# Patient Record
Sex: Male | Born: 1970 | Race: White | Hispanic: No | Marital: Married | Smoking: Never smoker
Health system: Southern US, Community
[De-identification: ages and names within clinical notes are randomized; demographics above are authoritative.]

## PROBLEM LIST (undated history)

## (undated) DIAGNOSIS — T4145XA Adverse effect of unspecified anesthetic, initial encounter: Secondary | ICD-10-CM

## (undated) DIAGNOSIS — R112 Nausea with vomiting, unspecified: Secondary | ICD-10-CM

## (undated) DIAGNOSIS — T8859XA Other complications of anesthesia, initial encounter: Secondary | ICD-10-CM

## (undated) DIAGNOSIS — N200 Calculus of kidney: Secondary | ICD-10-CM

## (undated) DIAGNOSIS — Z9889 Other specified postprocedural states: Secondary | ICD-10-CM

## (undated) DIAGNOSIS — Z87442 Personal history of urinary calculi: Secondary | ICD-10-CM

## (undated) DIAGNOSIS — N281 Cyst of kidney, acquired: Secondary | ICD-10-CM

## (undated) DIAGNOSIS — K900 Celiac disease: Secondary | ICD-10-CM

## (undated) HISTORY — DX: Calculus of kidney: N20.0

## (undated) HISTORY — PX: APPENDECTOMY: SHX54

## (undated) HISTORY — PX: CHOLECYSTECTOMY: SHX55

---

## 2011-12-02 ENCOUNTER — Emergency Department: Payer: Self-pay | Admitting: Emergency Medicine

## 2011-12-26 ENCOUNTER — Ambulatory Visit: Payer: Self-pay | Admitting: Emergency Medicine

## 2012-01-07 ENCOUNTER — Ambulatory Visit: Payer: Self-pay | Admitting: Emergency Medicine

## 2012-01-07 LAB — CBC WITH DIFFERENTIAL/PLATELET
Basophil %: 0.7 %
Eosinophil #: 0.1 10*3/uL (ref 0.0–0.7)
Eosinophil %: 1.8 %
HCT: 45.9 % (ref 40.0–52.0)
Lymphocyte #: 1.3 10*3/uL (ref 1.0–3.6)
Lymphocyte %: 20.8 %
MCHC: 33.9 g/dL (ref 32.0–36.0)
Monocyte #: 0.5 10*3/uL (ref 0.0–0.7)
Monocyte %: 8.1 %
Neutrophil %: 68.6 %
Platelet: 207 10*3/uL (ref 150–440)
RDW: 12.7 % (ref 11.5–14.5)
WBC: 6.2 10*3/uL (ref 3.8–10.6)

## 2012-01-07 LAB — HEPATIC FUNCTION PANEL A (ARMC)
Alkaline Phosphatase: 52 U/L (ref 50–136)
Bilirubin,Total: 0.6 mg/dL (ref 0.2–1.0)
SGOT(AST): 41 U/L — ABNORMAL HIGH (ref 15–37)

## 2012-01-09 ENCOUNTER — Ambulatory Visit: Payer: Self-pay | Admitting: Emergency Medicine

## 2015-04-09 NOTE — Op Note (Signed)
PATIENT NAME:  Ruben Webster, Ruben Webster MR#:  [id number] DATE OF BIRTH:  17-Jul-1971  DATE OF PROCEDURE:  01/09/2012  PREOPERATIVE DIAGNOSES:  1. Polyp in the gallbladder.  2. Acute cholecystitis.   POSTOPERATIVE DIAGNOSES:   1. Polyp in the gallbladder.  2. Acute cholecystitis.   PROCEDURE PERFORMED: Laparoscopic cholecystectomy with cholangiogram.   SURGEON: Yenny Kosa S. Annessa Satre, MD  INDICATION FOR SURGERY: This patient was seen by me in my office because of right upper quadrant abdominal pain for about a months duration. He also says that he does not feel like eating. He had upper GI endoscopy performed which was normal. CAT scan of the abdomen was done which was normal. Ultrasound showed a polyp in the gallbladder. Patient was then brought to surgery for removal of the gallbladder. We do not know what is causing his pain.   FINDINGS IN SURGERY: Patient had gallbladder filled with adhesions all the way down to the lower end of the gallbladder.   DESCRIPTION OF PROCEDURE: After he was put to sleep, a small incision was made over the umbilicus. After cutting skin and subcutaneous tissue, the fascia was cut and the abdomen was entered under direct vision. Another trocar was put in the epigastric region, two 5 mm put in the right upper quadrant of the abdomen. First of all initially when we went in the gallbladder seemed to be filled with adhesions so all the adhesions were slowly, slowly lysed down to the lower end of the gallbladder where cystic artery was then visualized. It was then clipped and cut. Dissection was done to find out where the cystic duct gallbladder junction was then I did a cholangiogram with Kumar clamp and the cholangiogram was normal. After that cystic duct was then clipped three times and then cut and gallbladder was lifted up from the liver bed and taken off completely. The bleeding from the liver bed was stopped. Irrigation of the liver bed was performed which showed there was no  bleeding or biliary leakage there. After this was done all the trocars were then removed under direct vision. Umbilical trocar was then closed with interrupted 0 Vicryl sutures and Marcaine was injected and staples applied. Patient tolerated procedure well, sent to recovery room in satisfactory condition.   ____________________________ Welford Roche Phylis Bougie, MD msh:cms D: 01/09/2012 11:51:29 ET T: 01/09/2012 12:41:52 ET JOB#: [id number]  cc: Imanii Gosdin S. Phylis Bougie, MD, <Dictator> Guadalupe Maple, MD Sharene Butters MD ELECTRONICALLY SIGNED 01/14/2012 13:03

## 2016-09-06 ENCOUNTER — Other Ambulatory Visit: Payer: Self-pay | Admitting: Internal Medicine

## 2016-09-06 DIAGNOSIS — K900 Celiac disease: Secondary | ICD-10-CM | POA: Insufficient documentation

## 2016-09-06 DIAGNOSIS — N200 Calculus of kidney: Secondary | ICD-10-CM

## 2016-09-06 DIAGNOSIS — R1084 Generalized abdominal pain: Secondary | ICD-10-CM | POA: Insufficient documentation

## 2016-09-13 ENCOUNTER — Ambulatory Visit
Admission: RE | Admit: 2016-09-13 | Discharge: 2016-09-13 | Disposition: A | Discharge status: Home / Self Care | Payer: BLUE CROSS/BLUE SHIELD | Source: Ambulatory Visit | Attending: Internal Medicine | Admitting: Internal Medicine

## 2016-09-13 DIAGNOSIS — N200 Calculus of kidney: Secondary | ICD-10-CM | POA: Diagnosis present

## 2016-09-13 DIAGNOSIS — N281 Cyst of kidney, acquired: Secondary | ICD-10-CM | POA: Insufficient documentation

## 2016-09-20 ENCOUNTER — Other Ambulatory Visit: Payer: Self-pay | Admitting: Internal Medicine

## 2016-09-20 DIAGNOSIS — N281 Cyst of kidney, acquired: Secondary | ICD-10-CM

## 2016-09-27 ENCOUNTER — Encounter
Admission: RE | Admit: 2016-09-27 | Discharge: 2016-09-27 | Disposition: A | Discharge status: Home / Self Care | Payer: BLUE CROSS/BLUE SHIELD | Source: Ambulatory Visit | Attending: Surgery | Admitting: Surgery

## 2016-09-27 HISTORY — DX: Other complications of anesthesia, initial encounter: T88.59XA

## 2016-09-27 HISTORY — DX: Personal history of urinary calculi: Z87.442

## 2016-09-27 HISTORY — DX: Adverse effect of unspecified anesthetic, initial encounter: T41.45XA

## 2016-09-27 HISTORY — DX: Nausea with vomiting, unspecified: R11.2

## 2016-09-27 HISTORY — DX: Cyst of kidney, acquired: N28.1

## 2016-09-27 HISTORY — DX: Other specified postprocedural states: Z98.890

## 2016-09-27 NOTE — Patient Instructions (Signed)
  Your procedure is scheduled on: 10-01-16 Report to Same Day Surgery 2nd floor medical mall To find out your arrival time please call [phone] between 1PM - 3PM on 09-30-16  Remember: Instructions that are not followed completely may result in serious medical risk, up to and including death, or upon the discretion of your surgeon and anesthesiologist your surgery may need to be rescheduled.    _x___ 1. Do not eat food or drink liquids after midnight. No gum chewing or hard candies.     __x__ 2. No Alcohol for 24 hours before or after surgery.   __x__3. No Smoking for 24 prior to surgery.   ____  4. Bring all medications with you on the day of surgery if instructed.    __x__ 5. Notify your doctor if there is any change in your medical condition     (cold, fever, infections).     Do not wear jewelry, make-up, hairpins, clips or nail polish.  Do not wear lotions, powders, or perfumes. You may wear deodorant.  Do not shave 48 hours prior to surgery. Men may shave face and neck.  Do not bring valuables to the hospital.    [facility] is not responsible for any belongings or valuables.               Contacts, dentures or bridgework may not be worn into surgery.  Leave your suitcase in the car. After surgery it may be brought to your room.  For patients admitted to the hospital, discharge time is determined by your treatment team.   Patients discharged the day of surgery will not be allowed to drive home.    Please read over the following fact sheets that you were given:   [facility] Preparing for Surgery and or MRSA Information   ____ Take these medicines the morning of surgery with A SIP OF WATER:    1. NONE  2.  3.  4.  5.  6.  ____Fleets enema or Magnesium Citrate as directed.   ____ Use CHG Soap or sage wipes as directed on instruction sheet   ____ Use inhalers on the day of surgery and bring to hospital day of surgery  ____ Stop metformin 2 days prior to  surgery    ____ Take 1/2 of usual insulin dose the night before surgery and none on the morning of surgery.   ____ Stop aspirin or coumadin, or plavix  x__ Stop Anti-inflammatories such as Advil, Aleve, Ibuprofen, Motrin, Naproxen,          Naprosyn, Goodies powders or aspirin products NOW-Ok to take Tylenol.   ____ Stop supplements until after surgery.    ____ Bring C-Pap to the hospital.

## 2016-10-01 ENCOUNTER — Encounter
Admission: RE | Disposition: A | Discharge status: Home / Self Care | Payer: Self-pay | Source: Ambulatory Visit | Attending: Surgery

## 2016-10-01 ENCOUNTER — Ambulatory Visit: Payer: BLUE CROSS/BLUE SHIELD | Admitting: Anesthesiology

## 2016-10-01 ENCOUNTER — Ambulatory Visit
Admission: RE | Admit: 2016-10-01 | Discharge: 2016-10-01 | Disposition: A | Discharge status: Home / Self Care | Payer: BLUE CROSS/BLUE SHIELD | Source: Ambulatory Visit | Attending: Surgery | Admitting: Surgery

## 2016-10-01 DIAGNOSIS — K4090 Unilateral inguinal hernia, without obstruction or gangrene, not specified as recurrent: Secondary | ICD-10-CM | POA: Insufficient documentation

## 2016-10-01 DIAGNOSIS — D176 Benign lipomatous neoplasm of spermatic cord: Secondary | ICD-10-CM | POA: Diagnosis not present

## 2016-10-01 HISTORY — PX: INGUINAL HERNIA REPAIR: SHX194

## 2016-10-01 SURGERY — REPAIR, HERNIA, INGUINAL, ADULT
Anesthesia: General | Laterality: Left | Wound class: Clean

## 2016-10-01 MED ORDER — FENTANYL CITRATE (PF) 100 MCG/2ML IJ SOLN
25.0000 ug | INTRAMUSCULAR | Status: DC | PRN
  Administered 2016-10-01 (×4): 25 ug via INTRAVENOUS

## 2016-10-01 MED ORDER — FAMOTIDINE 20 MG PO TABS
ORAL_TABLET | ORAL | Status: AC
  Administered 2016-10-01: 20 mg via ORAL
  Filled 2016-10-01: qty 1

## 2016-10-01 MED ORDER — SUGAMMADEX SODIUM 200 MG/2ML IV SOLN
INTRAVENOUS | Status: DC | PRN
  Administered 2016-10-01: 180 mg via INTRAVENOUS

## 2016-10-01 MED ORDER — FENTANYL CITRATE (PF) 100 MCG/2ML IJ SOLN
INTRAMUSCULAR | Status: AC
  Filled 2016-10-01: qty 2

## 2016-10-01 MED ORDER — HYDROCODONE-ACETAMINOPHEN 5-325 MG PO TABS
ORAL_TABLET | ORAL | Status: AC
  Filled 2016-10-01: qty 1

## 2016-10-01 MED ORDER — VANCOMYCIN HCL IN DEXTROSE 1-5 GM/200ML-% IV SOLN
INTRAVENOUS | Status: AC
  Administered 2016-10-01: 1 g via INTRAVENOUS
  Filled 2016-10-01: qty 200

## 2016-10-01 MED ORDER — VANCOMYCIN HCL IN DEXTROSE 1-5 GM/200ML-% IV SOLN
1000.0000 mg | Freq: Once | INTRAVENOUS | Status: AC
  Administered 2016-10-01: 1 g via INTRAVENOUS

## 2016-10-01 MED ORDER — BUPIVACAINE-EPINEPHRINE (PF) 0.5% -1:200000 IJ SOLN
INTRAMUSCULAR | Status: DC | PRN
  Administered 2016-10-01: 18 mL via PERINEURAL

## 2016-10-01 MED ORDER — MIDAZOLAM HCL 2 MG/2ML IJ SOLN
INTRAMUSCULAR | Status: DC | PRN
  Administered 2016-10-01 (×2): 2 mg via INTRAVENOUS

## 2016-10-01 MED ORDER — HYDROCODONE-ACETAMINOPHEN 5-325 MG PO TABS
1.0000 | ORAL_TABLET | ORAL | Status: DC | PRN
  Administered 2016-10-01: 1 via ORAL

## 2016-10-01 MED ORDER — HYDROCODONE-ACETAMINOPHEN 5-325 MG PO TABS: 1.0000 | ORAL_TABLET | ORAL | 0 refills | Status: DC | PRN

## 2016-10-01 MED ORDER — SUCCINYLCHOLINE CHLORIDE 20 MG/ML IJ SOLN
INTRAMUSCULAR | Status: DC | PRN
Start: 2016-10-01 — End: 2016-10-01
  Administered 2016-10-01: 100 mg via INTRAVENOUS

## 2016-10-01 MED ORDER — FAMOTIDINE 20 MG PO TABS
20.0000 mg | ORAL_TABLET | Freq: Once | ORAL | Status: AC
  Administered 2016-10-01: 20 mg via ORAL

## 2016-10-01 MED ORDER — FENTANYL CITRATE (PF) 100 MCG/2ML IJ SOLN
INTRAMUSCULAR | Status: DC | PRN
  Administered 2016-10-01: 100 ug via INTRAVENOUS
  Administered 2016-10-01: 50 ug via INTRAVENOUS

## 2016-10-01 MED ORDER — BUPIVACAINE-EPINEPHRINE (PF) 0.5% -1:200000 IJ SOLN
INTRAMUSCULAR | Status: AC
  Filled 2016-10-01: qty 30

## 2016-10-01 MED ORDER — ONDANSETRON HCL 4 MG/2ML IJ SOLN: 4.0000 mg | Freq: Once | INTRAMUSCULAR | Status: DC | PRN

## 2016-10-01 MED ORDER — PROPOFOL 10 MG/ML IV BOLUS
INTRAVENOUS | Status: DC | PRN
  Administered 2016-10-01: 180 mg via INTRAVENOUS

## 2016-10-01 MED ORDER — BUPIVACAINE-EPINEPHRINE (PF) 0.5% -1:200000 IJ SOLN
INTRAMUSCULAR | Status: AC
  Filled 2016-10-01: qty 10

## 2016-10-01 MED ORDER — LACTATED RINGERS IV SOLN
INTRAVENOUS | Status: DC
  Administered 2016-10-01: 11:00:00 via INTRAVENOUS

## 2016-10-01 MED ORDER — ONDANSETRON HCL 4 MG/2ML IJ SOLN
INTRAMUSCULAR | Status: DC | PRN
Start: 2016-10-01 — End: 2016-10-01
  Administered 2016-10-01: 4 mg via INTRAVENOUS

## 2016-10-01 MED ORDER — ROCURONIUM BROMIDE 100 MG/10ML IV SOLN
INTRAVENOUS | Status: DC | PRN
  Administered 2016-10-01: 10 mg via INTRAVENOUS
  Administered 2016-10-01: 35 mg via INTRAVENOUS
  Administered 2016-10-01: 10 mg via INTRAVENOUS
  Administered 2016-10-01: 5 mg via INTRAVENOUS

## 2016-10-01 MED ORDER — LIDOCAINE HCL (CARDIAC) 20 MG/ML IV SOLN
INTRAVENOUS | Status: DC | PRN
  Administered 2016-10-01: 100 mg via INTRAVENOUS

## 2016-10-01 SURGICAL SUPPLY — 27 items
BLADE SURG 15 STRL LF DISP TIS (BLADE) ×1 IMPLANT
BLADE SURG 15 STRL SS (BLADE) ×1
CANISTER SUCT 1200ML W/VALVE (MISCELLANEOUS) ×2 IMPLANT
CHLORAPREP W/TINT 26ML (MISCELLANEOUS) ×2 IMPLANT
DERMABOND ADVANCED (GAUZE/BANDAGES/DRESSINGS) ×1
DERMABOND ADVANCED .7 DNX12 (GAUZE/BANDAGES/DRESSINGS) ×1 IMPLANT
DRAIN PENROSE 5/8X18 LTX STRL (WOUND CARE) ×2 IMPLANT
DRAPE LAPAROTOMY 77X122 PED (DRAPES) ×2 IMPLANT
ELECT REM PT RETURN 9FT ADLT (ELECTROSURGICAL) ×2
ELECTRODE REM PT RTRN 9FT ADLT (ELECTROSURGICAL) ×1 IMPLANT
GLOVE BIO SURGEON STRL SZ7.5 (GLOVE) ×10 IMPLANT
GOWN STRL REUS W/ TWL LRG LVL3 (GOWN DISPOSABLE) ×3 IMPLANT
GOWN STRL REUS W/TWL LRG LVL3 (GOWN DISPOSABLE) ×3
KIT RM TURNOVER STRD PROC AR (KITS) ×2 IMPLANT
LABEL OR SOLS (LABEL) ×2 IMPLANT
LIQUID BAND (GAUZE/BANDAGES/DRESSINGS) ×2 IMPLANT
MESH SYNTHETIC 4X6 SOFT BARD (Mesh General) ×1 IMPLANT
MESH SYNTHETIC SOFT BARD 4X6 (Mesh General) ×1 IMPLANT
NEEDLE HYPO 25X1 1.5 SAFETY (NEEDLE) ×2 IMPLANT
NS IRRIG 500ML POUR BTL (IV SOLUTION) ×2 IMPLANT
PACK BASIN MINOR ARMC (MISCELLANEOUS) ×2 IMPLANT
SUT CHROMIC 4 0 RB 1X27 (SUTURE) ×2 IMPLANT
SUT MNCRL AB 4-0 PS2 18 (SUTURE) ×2 IMPLANT
SUT SURGILON 0 30 BLK (SUTURE) ×6 IMPLANT
SUT VIC AB 4-0 SH 27 (SUTURE) ×2
SUT VIC AB 4-0 SH 27XANBCTRL (SUTURE) ×2 IMPLANT
SYRINGE 10CC LL (SYRINGE) ×2 IMPLANT

## 2016-10-01 NOTE — Discharge Instructions (Addendum)
AMBULATORY SURGERY  DISCHARGE INSTRUCTIONS   1) The drugs that you were given will stay in your system until tomorrow so for the next 24 hours you should not:  A) Drive an automobile B) Make any legal decisions C) Drink any alcoholic beverage   2) You may resume regular meals tomorrow.  Today it is better to start with liquids and gradually work up to solid foods.  You may eat anything you prefer, but it is better to start with liquids, then soup and crackers, and gradually work up to solid foods.   3) Please notify your doctor immediately if you have any unusual bleeding, trouble breathing, redness and pain at the surgery site, drainage, fever, or pain not relieved by medication.    4) Additional Instructions:        Please contact your physician with any problems or Same Day Surgery at [phone], Monday through Friday 6 am to 4 pm, or [place] at [facility] number at [phone].Take Tylenol or Norco if needed for pain.  Should not drive or do anything dangerous when taking Norco.  May shower.  Avoid straining and heavy lifting.

## 2016-10-01 NOTE — Anesthesia Procedure Notes (Addendum)
Procedure Name: Intubation Performed by: Lance Muss Pre-anesthesia Checklist: Patient identified, Patient being monitored, Timeout performed, Emergency Drugs available and Suction available Patient Re-evaluated:Patient Re-evaluated prior to inductionOxygen Delivery Method: Circle system utilized Preoxygenation: Pre-oxygenation with 100% oxygen Intubation Type: IV induction Ventilation: Mask ventilation without difficulty Laryngoscope Size: Mac and 3 Grade View: Grade I Tube type: Oral Tube size: 7.5 mm Number of attempts: 1 Airway Equipment and Method: Stylet Placement Confirmation: ETT inserted through vocal cords under direct vision,  positive ETCO2 and breath sounds checked- equal and bilateral Secured at: 22 cm Tube secured with: Tape Dental Injury: Teeth and Oropharynx as per pre-operative assessment  Comments: Preexisting chipped front top right tooth

## 2016-10-01 NOTE — Transfer of Care (Signed)
Immediate Anesthesia Transfer of Care Note  Patient: Ruben Webster  Procedure(s) Performed: Procedure(s): HERNIA REPAIR INGUINAL ADULT with mesh (Left)  Patient Location: PACU  Anesthesia Type:General  Level of Consciousness: sedated and responds to stimulation  Airway & Oxygen Therapy: Patient Spontanous Breathing and Patient connected to face mask oxygen  Post-op Assessment: Report given to RN and Post -op Vital signs reviewed and stable  Post vital signs: Reviewed and stable  Last Vitals:  Vitals:   10/01/16 1325 10/01/16 1326  BP: (!) (P) 138/95 (!) 138/95  Pulse:  84  Resp: (P) 17 19  Temp: (P) 36.7 C     Last Pain:  Vitals:   10/01/16 1024  TempSrc: Oral  PainSc: 3          Complications: No apparent anesthesia complications

## 2016-10-01 NOTE — Op Note (Signed)
OPERATIVE REPORT  PREOPERATIVE DIAGNOSIS: left inguinal hernia  POSTOPERATIVE DIAGNOSIS:left  inguinal hernia  PROCEDURE:  left inguinal hernia repair  ANESTHESIA:  General  SURGEON:  Rochel Brome M.D.  INDICATIONS: He has had recent bulging in the left groin with moderate discomfort. A left inguinal hernia was demonstrated on physical exam and repair was recommended for definitive treatment.  With the patient on the operating table in the supine position the left lower quadrant was prepared with clippers and with ChloraPrep and draped in a sterile manner. A transversely oriented suprapubic incision was made and carried down through subcutaneous tissues. Electrocautery was used for hemostasis. The Scarpa's fascia was incised. The external oblique aponeurosis was incised along the course of its fibers to open the external ring and expose the inguinal cord structures. The cord structures were mobilized. A Penrose drain was passed around the cord structures for traction. Cremaster fibers were separated to expose an indirect hernia sac. The sac was dissected free from surrounding structures and followed up to the internal ring. The sac was opened and its continuity with the peritoneal cavity was demonstrated. A large portion of the sac was made up of fat. The sac was partially ligated with a 0 Surgilon pursestring suture and amputated. The thicker fatty portion of the wall of the sac was reduced back into the abdominal cavity. There was also a cord lipoma which was dissected free from surrounding structures and suture ligated with 4-0 Vicryl and amputated. Tissues were not submitted for pathology. The repair was done with 0 Surgilon suturing the conjoined tendon to the shelving edge of the inguinal ligament incorporating transversalis fascia into the repair the last stitch led to satisfactory narrowing of the internal ring.  Bard soft mesh was cut to create an oval shape and was placed over the repair.  This was sutured to the repair with interrupted 0 Surgilon sutures and also sutured medially to the deep fascia and on both sides of the internal ring. Next after seeing hemostasis was intact the cord structures were replaced along the floor of the inguinal canal. The cut edges of the external oblique aponeurosis were closed with a running 4-0 Vicryl suture to re-create the external ring. The deep fascia superior and lateral to the repair site was infiltrated with half percent Sensorcaine with epinephrine. Subcutaneous tissues were also infiltrated. The Scarpa's fascia was closed with interrupted 4-0 Vicryl sutures. The skin was closed with running 4-0 Monocryl subcuticular suture and LiquiBand. The testicle remained in the scrotum  The patient appeared to be in satisfactory condition and was prepared for transfer to the recovery room.  Rochel Brome M.D.

## 2016-10-01 NOTE — H&P (Signed)
  He comes in today for left inguinal hernia repair.  He reports no change in condition since the office visit.  Lab work was reviewed  The left side was marked YES

## 2016-10-01 NOTE — OR Nursing (Signed)
Dr Tamala Julian in to see patient. Patient tolerating po fluids and apple sauce.

## 2016-10-01 NOTE — Anesthesia Preprocedure Evaluation (Addendum)
Anesthesia Evaluation  Patient identified by MRN, date of birth, ID band Patient awake    Reviewed: Allergy & Precautions, NPO status , Patient's Chart, lab work & pertinent test results  History of Anesthesia Complications (+) PONV  Airway Mallampati: II  TM Distance: >3 FB     Dental  (+) Chipped   Pulmonary neg pulmonary ROS,    Pulmonary exam normal        Cardiovascular negative cardio ROS Normal cardiovascular exam     Neuro/Psych negative neurological ROS  negative psych ROS   GI/Hepatic Neg liver ROS,   Endo/Other  negative endocrine ROS  Renal/GU Renal cyst  negative genitourinary   Musculoskeletal negative musculoskeletal ROS (+)   Abdominal Normal abdominal exam  (+)   Peds negative pediatric ROS (+)  Hematology negative hematology ROS (+)   Anesthesia Other Findings   Reproductive/Obstetrics                            Anesthesia Physical Anesthesia Plan  ASA: II  Anesthesia Plan: General   Post-op Pain Management:    Induction: Intravenous  Airway Management Planned: Oral ETT  Additional Equipment:   Intra-op Plan:   Post-operative Plan: Extubation in OR  Informed Consent: I have reviewed the patients History and Physical, chart, labs and discussed the procedure including the risks, benefits and alternatives for the proposed anesthesia with the patient or authorized representative who has indicated his/her understanding and acceptance.   Dental advisory given  Plan Discussed with: CRNA and Surgeon  Anesthesia Plan Comments:         Anesthesia Quick Evaluation

## 2016-10-02 ENCOUNTER — Encounter: Payer: Self-pay | Admitting: Surgery

## 2016-10-02 NOTE — Anesthesia Postprocedure Evaluation (Signed)
Anesthesia Post Note  Patient: Ruben Webster  Procedure(s) Performed: Procedure(s) (LRB): HERNIA REPAIR INGUINAL ADULT with mesh (Left)  Patient location during evaluation: PACU Anesthesia Type: General Level of consciousness: awake and alert and oriented Pain management: pain level controlled Vital Signs Assessment: post-procedure vital signs reviewed and stable Respiratory status: spontaneous breathing Cardiovascular status: blood pressure returned to baseline Anesthetic complications: no    Last Vitals:  Vitals:   10/01/16 1434 10/01/16 1511  BP: 140/85 129/80  Pulse: 81 79  Resp: 16 16  Temp: 37 C 36.6 C    Last Pain:  Vitals:   10/02/16 1046  TempSrc:   PainSc: 0-No pain                 Enedelia Martorelli

## 2016-10-03 ENCOUNTER — Ambulatory Visit: Payer: BLUE CROSS/BLUE SHIELD

## 2016-10-14 ENCOUNTER — Encounter: Payer: Self-pay | Admitting: Surgery

## 2016-10-19 ENCOUNTER — Ambulatory Visit
Admission: RE | Admit: 2016-10-19 | Discharge: 2016-10-19 | Disposition: A | Discharge status: Home / Self Care | Payer: BLUE CROSS/BLUE SHIELD | Source: Ambulatory Visit | Attending: Internal Medicine | Admitting: Internal Medicine

## 2016-10-19 DIAGNOSIS — N281 Cyst of kidney, acquired: Secondary | ICD-10-CM | POA: Insufficient documentation

## 2016-10-19 MED ORDER — GADOBENATE DIMEGLUMINE 529 MG/ML IV SOLN
20.0000 mL | Freq: Once | INTRAVENOUS | Status: AC | PRN
Start: 2016-10-19 — End: 2016-10-19
  Administered 2016-10-19: 18 mL via INTRAVENOUS

## 2017-09-08 DIAGNOSIS — R030 Elevated blood-pressure reading, without diagnosis of hypertension: Secondary | ICD-10-CM | POA: Insufficient documentation

## 2018-01-19 DIAGNOSIS — R1900 Intra-abdominal and pelvic swelling, mass and lump, unspecified site: Secondary | ICD-10-CM | POA: Insufficient documentation

## 2018-01-20 ENCOUNTER — Other Ambulatory Visit: Payer: Self-pay | Admitting: Internal Medicine

## 2018-01-20 DIAGNOSIS — R1900 Intra-abdominal and pelvic swelling, mass and lump, unspecified site: Secondary | ICD-10-CM

## 2018-01-20 DIAGNOSIS — R634 Abnormal weight loss: Secondary | ICD-10-CM

## 2018-01-20 DIAGNOSIS — R1032 Left lower quadrant pain: Secondary | ICD-10-CM

## 2018-01-28 ENCOUNTER — Ambulatory Visit
Admission: RE | Admit: 2018-01-28 | Discharge: 2018-01-28 | Disposition: A | Discharge status: Home / Self Care | Payer: BC Managed Care – PPO | Source: Ambulatory Visit | Attending: Internal Medicine | Admitting: Internal Medicine

## 2018-01-28 DIAGNOSIS — R1032 Left lower quadrant pain: Secondary | ICD-10-CM | POA: Diagnosis present

## 2018-01-28 DIAGNOSIS — N200 Calculus of kidney: Secondary | ICD-10-CM | POA: Diagnosis not present

## 2018-01-28 DIAGNOSIS — K7689 Other specified diseases of liver: Secondary | ICD-10-CM | POA: Insufficient documentation

## 2018-01-28 DIAGNOSIS — R918 Other nonspecific abnormal finding of lung field: Secondary | ICD-10-CM | POA: Insufficient documentation

## 2018-01-28 DIAGNOSIS — N281 Cyst of kidney, acquired: Secondary | ICD-10-CM | POA: Insufficient documentation

## 2018-01-28 DIAGNOSIS — R1900 Intra-abdominal and pelvic swelling, mass and lump, unspecified site: Secondary | ICD-10-CM | POA: Diagnosis present

## 2018-01-28 DIAGNOSIS — R634 Abnormal weight loss: Secondary | ICD-10-CM | POA: Diagnosis not present

## 2018-01-28 MED ORDER — IOPAMIDOL (ISOVUE-300) INJECTION 61%
100.0000 mL | Freq: Once | INTRAVENOUS | Status: AC | PRN
  Administered 2018-01-28: 100 mL via INTRAVENOUS

## 2018-02-05 ENCOUNTER — Other Ambulatory Visit
Admission: RE | Admit: 2018-02-05 | Discharge: 2018-02-05 | Disposition: A | Discharge status: Home / Self Care | Payer: BC Managed Care – PPO | Source: Ambulatory Visit | Attending: Gastroenterology | Admitting: Gastroenterology

## 2018-02-13 ENCOUNTER — Other Ambulatory Visit: Payer: Self-pay | Admitting: Gastroenterology

## 2018-02-13 DIAGNOSIS — R1032 Left lower quadrant pain: Secondary | ICD-10-CM

## 2018-02-13 LAB — MISCELLANEOUS TEST

## 2018-02-17 ENCOUNTER — Other Ambulatory Visit: Payer: Self-pay | Admitting: Internal Medicine

## 2018-02-17 DIAGNOSIS — R911 Solitary pulmonary nodule: Secondary | ICD-10-CM

## 2018-02-23 ENCOUNTER — Ambulatory Visit
Admission: RE | Admit: 2018-02-23 | Discharge: 2018-02-23 | Disposition: A | Discharge status: Home / Self Care | Payer: BC Managed Care – PPO | Source: Ambulatory Visit | Attending: Gastroenterology | Admitting: Gastroenterology

## 2018-02-23 DIAGNOSIS — R1012 Left upper quadrant pain: Secondary | ICD-10-CM | POA: Insufficient documentation

## 2018-02-23 DIAGNOSIS — R1032 Left lower quadrant pain: Secondary | ICD-10-CM | POA: Diagnosis present

## 2018-03-03 ENCOUNTER — Ambulatory Visit
Admission: RE | Admit: 2018-03-03 | Discharge: 2018-03-03 | Disposition: A | Discharge status: Home / Self Care | Payer: BC Managed Care – PPO | Source: Ambulatory Visit | Attending: Internal Medicine | Admitting: Internal Medicine

## 2018-03-03 DIAGNOSIS — R911 Solitary pulmonary nodule: Secondary | ICD-10-CM | POA: Diagnosis present

## 2018-03-03 DIAGNOSIS — R918 Other nonspecific abnormal finding of lung field: Secondary | ICD-10-CM | POA: Diagnosis not present

## 2018-03-05 DIAGNOSIS — R918 Other nonspecific abnormal finding of lung field: Secondary | ICD-10-CM | POA: Insufficient documentation

## 2018-03-10 ENCOUNTER — Ambulatory Visit
Admission: RE | Admit: 2018-03-10 | Discharge: 2018-03-10 | Disposition: A | Discharge status: Home / Self Care | Payer: BC Managed Care – PPO | Source: Ambulatory Visit | Attending: Urology | Admitting: Urology

## 2018-03-10 ENCOUNTER — Ambulatory Visit: Payer: BC Managed Care – PPO | Admitting: Urology

## 2018-03-10 ENCOUNTER — Encounter: Payer: Self-pay | Admitting: Urology

## 2018-03-10 VITALS — BP 118/80 | HR 83 | Resp 16 | Ht 67.5 in | Wt 198.8 lb

## 2018-03-10 DIAGNOSIS — N200 Calculus of kidney: Secondary | ICD-10-CM | POA: Diagnosis not present

## 2018-03-10 LAB — URINALYSIS, COMPLETE
BILIRUBIN UA: NEGATIVE
Glucose, UA: NEGATIVE
Ketones, UA: NEGATIVE
Leukocytes, UA: NEGATIVE
Nitrite, UA: NEGATIVE
PROTEIN UA: NEGATIVE
RBC, UA: NEGATIVE
Specific Gravity, UA: <1.005 — ABNORMAL LOW (ref 1.005–1.030)
Urobilinogen, Ur: 0.2 mg/dL (ref 0.2–1.0)
pH, UA: 6.5 (ref 5.0–7.5)

## 2018-03-12 ENCOUNTER — Other Ambulatory Visit: Payer: Self-pay | Admitting: Gastroenterology

## 2018-03-12 DIAGNOSIS — R1013 Epigastric pain: Secondary | ICD-10-CM

## 2018-03-13 NOTE — Progress Notes (Signed)
03/10/2018 7:04 AM   Lyla Glassing 09-27-1971 [id number]  Referring provider: Adin Hector, MD [facility] [place], [place] [address]  Chief complaint: Stomach pain, weight loss  HPI: Ruben Webster is a 47 year old male seen in consultation at the request of Dr. Caryl Comes for evaluation of nephrolithiasis.  He has been seen by Dr. Caryl Comes and gastroenterology for a 72-month history of left lower quadrant abdominal pain and abnormal weight loss.  His evaluation to date has been negative.  A CT scan of the abdomen pelvis incidentally found a nonobstructing 4 mm left lower pole calculus.  He has no prior history of stone disease.  He has no bothersome lower urinary tract symptoms.  Denies dysuria or gross hematuria.  Denies flank or pelvic pain.  He states his abdominal pain has actually improved over the last 2 weeks.   PMH: Past Medical History:  Diagnosis Date  . Complication of anesthesia   . History of kidney stones    H/O STONES  . PONV (postoperative nausea and vomiting)   . Renal cyst     Surgical History: Past Surgical History:  Procedure Laterality Date  . APPENDECTOMY    . CHOLECYSTECTOMY    . INGUINAL HERNIA REPAIR Left 10/01/2016   Procedure: HERNIA REPAIR INGUINAL ADULT with mesh;  Surgeon: Leonie Green, MD;  Location: [facility] ORS;  Service: General;  Laterality: Left;    Home Medications:  Allergies as of 03/10/2018      Reactions   Other    GLUTEN ALLERGY   Amoxicillin Rash   Penicillins Rash      Medication List        Accurate as of 03/10/18 11:59 PM. Always use your most recent med list.          HYDROcodone-acetaminophen 5-325 MG tablet Commonly known as:  NORCO Take 1-2 tablets by mouth every 4 (four) hours as needed for moderate pain.   ibuprofen 200 MG tablet Commonly known as:  ADVIL,MOTRIN Take 200 mg by mouth every 6 (six) hours as needed.   pantoprazole 40 MG tablet Commonly known as:  PROTONIX     sucralfate 1 g tablet Commonly known as:  CARAFATE Take by mouth.       Allergies:  Allergies  Allergen Reactions  . Other     GLUTEN ALLERGY  . Amoxicillin Rash  . Penicillins Rash    Family History: Family History  Problem Relation Age of Onset  . Prostate cancer Father   . Bladder Cancer Neg Hx   . Kidney cancer Neg Hx     Social History:  reports that he has never smoked. He has never used smokeless tobacco. He reports that he does not drink alcohol or use drugs.  ROS: UROLOGY Frequent Urination?: No Hard to postpone urination?: No Burning/pain with urination?: No Get up at night to urinate?: No Leakage of urine?: No Urine stream starts and stops?: No Trouble starting stream?: No Do you have to strain to urinate?: No Blood in urine?: No Urinary tract infection?: No Sexually transmitted disease?: No Injury to kidneys or bladder?: No Painful intercourse?: No Weak stream?: No Erection problems?: No Penile pain?: No  Gastrointestinal Nausea?: No Vomiting?: No Indigestion/heartburn?: No Diarrhea?: No Constipation?: No  Constitutional Fever: No Night sweats?: No Weight loss?: No Fatigue?: No  Skin Skin rash/lesions?: No Itching?: No  Eyes Blurred vision?: No Double vision?: No  Ears/Nose/Throat Sore throat?: No Sinus problems?: No  Hematologic/Lymphatic Swollen glands?: No  Easy bruising?: No  Cardiovascular Leg swelling?: No Chest pain?: No  Respiratory Cough?: No Shortness of breath?: No  Endocrine Excessive thirst?: No  Musculoskeletal Back pain?: No Joint pain?: No  Neurological Headaches?: No Dizziness?: No  Psychologic Depression?: No Anxiety?: No  Physical Exam: BP 118/80   Pulse 83   Resp 16   Ht 5' 7.5" (1.715 m)   Wt 198 lb 12.8 oz (90.2 kg)   SpO2 96%   BMI 30.68 kg/m   Constitutional:  Alert and oriented, No acute distress. HEENT: [place] AT, moist mucus membranes.  Trachea midline, no  masses. Cardiovascular: No clubbing, cyanosis, or edema. Respiratory: Normal respiratory effort, no increased work of breathing. GI: Abdomen is soft, nontender, nondistended, no abdominal masses GU: No CVA tenderness Lymph: No cervical or inguinal lymphadenopathy. Skin: No rashes, bruises or suspicious lesions. Neurologic: Grossly intact, no focal deficits, moving all 4 extremities. Psychiatric: Normal mood and affect.  Laboratory Data: Lab Results  Component Value Date   WBC 6.2 01/07/2012   HGB 15.6 01/07/2012   HCT 45.9 01/07/2012   MCV 88 01/07/2012   PLT 207 01/07/2012   Urinalysis Dipstick/microscopy negative  Pertinent Imaging: CT images were personally reviewed   Assessment & Plan:    1. Nephrolithiasis 47 year old male with a nonobstructing left lower pole calculus.  He was informed that this would not be a source of his abdominal pain or weight loss.  We discussed stone prevention guidelines and he was provided literature.  I have recommended a follow-up visit and KUB in 1 year.  He was instructed to call earlier for development of renal colic.  - Urinalysis, Complete - Abdomen 1 view (KUB); Future   Return in about 1 year (around 03/11/2019) for [place], KUB.  Abbie Sons, [place] [address], [place], [place] [address] [phone]

## 2018-03-16 ENCOUNTER — Telehealth: Payer: Self-pay

## 2018-03-16 ENCOUNTER — Encounter: Payer: Self-pay | Admitting: Urology

## 2018-03-16 NOTE — Telephone Encounter (Signed)
-----   Message from Abbie Sons, MD sent at 03/13/2018  1:09 PM EDT ----- The left renal calculus is easily visualized on KUB.  Follow-up as scheduled and call earlier for development of renal colic.

## 2018-03-16 NOTE — Telephone Encounter (Signed)
Patient notified

## 2018-03-25 ENCOUNTER — Ambulatory Visit
Admission: RE | Admit: 2018-03-25 | Discharge: 2018-03-25 | Disposition: A | Discharge status: Home / Self Care | Payer: BC Managed Care – PPO | Source: Ambulatory Visit | Attending: Gastroenterology | Admitting: Gastroenterology

## 2018-03-25 DIAGNOSIS — R1013 Epigastric pain: Secondary | ICD-10-CM | POA: Diagnosis present

## 2018-03-25 MED ORDER — TECHNETIUM TC 99M SULFUR COLLOID
2.4280 | Freq: Once | INTRAVENOUS | Status: AC | PRN
  Administered 2018-03-25: 2.568 via ORAL

## 2018-04-09 ENCOUNTER — Other Ambulatory Visit: Payer: BC Managed Care – PPO

## 2018-12-02 ENCOUNTER — Other Ambulatory Visit
Admission: RE | Admit: 2018-12-02 | Discharge: 2018-12-02 | Disposition: A | Discharge status: Home / Self Care | Payer: BC Managed Care – PPO | Source: Ambulatory Visit | Attending: Internal Medicine | Admitting: Internal Medicine

## 2018-12-02 DIAGNOSIS — R079 Chest pain, unspecified: Secondary | ICD-10-CM | POA: Diagnosis present

## 2018-12-02 LAB — TROPONIN I: Troponin I: <0.03 ng/mL (ref <0.03)

## 2019-03-04 ENCOUNTER — Telehealth: Payer: Self-pay | Admitting: Urology

## 2019-03-04 DIAGNOSIS — N200 Calculus of kidney: Secondary | ICD-10-CM

## 2019-03-04 NOTE — Telephone Encounter (Signed)
Order was entered 

## 2019-03-04 NOTE — Telephone Encounter (Signed)
Patient has a follow up with you but there is not an order for his KUB   Thanks  Sharyn Lull

## 2019-03-11 ENCOUNTER — Ambulatory Visit: Payer: BC Managed Care – PPO | Admitting: Urology

## 2019-04-12 IMAGING — NM NM GASTRIC EMPTYING
6 series · 20 of 20 positions shown · non-contrast
Comparison: None.

CLINICAL DATA: Nausea and abdominal pain

EXAM:
NUCLEAR MEDICINE GASTRIC EMPTYING SCAN
TECHNIQUE: After oral ingestion of radiolabeled meal, sequential abdominal
images were obtained for 4 hours. Percentage of activity emptying
the stomach was calculated at 1 hour, 2 hour, 3 hour, and 4 hours.
RADIOPHARMACEUTICALS:  2.428 mCi Rc-GGm sulfur colloid in
standardized meal including egg

[Series 1000: gatric statics · 3.90mm/px · 2 of 2 frames shown (1 of 2)]
[frame 1/2]
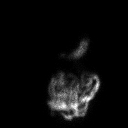
[frame 2/2]
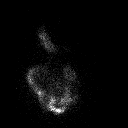

[Series 1000: gastric statics · 3.90mm/px · 2 of 2 frames shown (1 of 3)]
[frame 1/2]
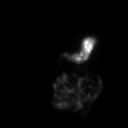
[frame 2/2]
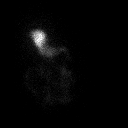

[Series 1000: gastric statics · 3.90mm/px · 2 of 2 frames shown (2 of 3)]
[frame 1/2]
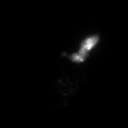
[frame 2/2]
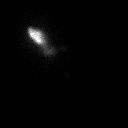

[Series 1000: gatric statics · 3.90mm/px · 2 of 2 frames shown (2 of 2)]
[frame 1/2]
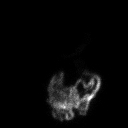
[frame 2/2]
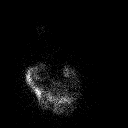

[Series 1000: gatric statics (results) · 3.90mm/px · 5 acquisitions, 10 frames shown]
[im 1/5]
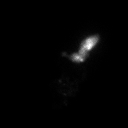
[im 1/5]
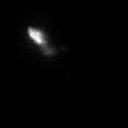
[im 2/5]
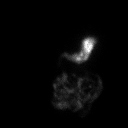
[im 2/5]
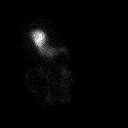
[im 3/5]
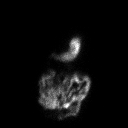
[im 3/5]
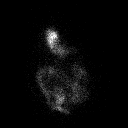
[im 4/5]
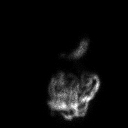
[im 4/5]
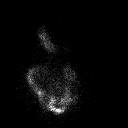
[im 5/5]
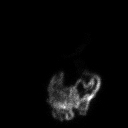
[im 5/5]
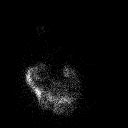

[Series 1000: gastric statics · 3.90mm/px · 2 of 2 frames shown (3 of 3)]
[frame 1/2]
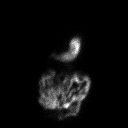
[frame 2/2]
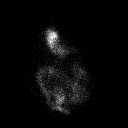

[20 of 20 positions shown; findings below may reference images not displayed]

FINDINGS: Expected location of the stomach in the left upper quadrant.
Ingested meal empties the stomach gradually over the course of the
study.

34% emptied at 1 hr ( normal >= 10%)

73% emptied at 2 hr ( normal >= 40%)

90% emptied at 3 hr ( normal >= 70%)

99% emptied at 4 hr ( normal >= 90%)
IMPRESSION: Normal  gastric emptying study.

## 2019-04-26 ENCOUNTER — Other Ambulatory Visit: Payer: Self-pay

## 2019-04-26 ENCOUNTER — Ambulatory Visit
Admission: RE | Admit: 2019-04-26 | Discharge: 2019-04-26 | Disposition: A | Discharge status: Home / Self Care | Payer: BC Managed Care – PPO | Attending: Urology | Admitting: Urology

## 2019-04-26 ENCOUNTER — Ambulatory Visit
Admission: RE | Admit: 2019-04-26 | Discharge: 2019-04-26 | Disposition: A | Discharge status: Home / Self Care | Payer: BC Managed Care – PPO | Source: Ambulatory Visit | Attending: Urology | Admitting: Urology

## 2019-04-26 DIAGNOSIS — N200 Calculus of kidney: Secondary | ICD-10-CM | POA: Insufficient documentation

## 2019-04-29 ENCOUNTER — Telehealth (INDEPENDENT_AMBULATORY_CARE_PROVIDER_SITE_OTHER): Payer: BC Managed Care – PPO | Admitting: Urology

## 2019-04-29 ENCOUNTER — Other Ambulatory Visit: Payer: Self-pay

## 2019-04-29 DIAGNOSIS — N200 Calculus of kidney: Secondary | ICD-10-CM

## 2019-04-29 NOTE — Progress Notes (Signed)
Virtual Visit via Video Note  I connected with Ruben Webster on 04/29/19 at  9:30 AM EDT by a video enabled telemedicine application and verified that I am speaking with the correct person using two identifiers.  Location: Patient: Home Provider: Home office   I discussed the limitations of evaluation and management by telemedicine and the availability of in person appointments. The patient expressed understanding and agreed to proceed.  History of Present Illness: 48 year old male for annual follow-up of nephrolithiasis.  Video visit was performed today secondary to COVID-19 pandemic.  Overall he has been doing well.  Denies gross hematuria or left-sided flank/abdominal pain.  He does note occasional right lower back pain.  A KUB performed on 04/26/2019 was reviewed and the left lower pole calculus has not changed in size or location.    Observations/Objective: Alert, no acute distress  Assessment and Plan: 48 year old male with an asymptomatic, nonobstructing left lower pole calculus.  He was informed his right back pain is most likely musculoskeletal.  Follow Up Instructions: Follow-up KUB 1 year and he was directed to call earlier for development of left flank or abdominal pain.   I discussed the assessment and treatment plan with the patient. The patient was provided an opportunity to ask questions and all were answered. The patient agreed with the plan and demonstrated an understanding of the instructions.   The patient was advised to call back or seek an in-person evaluation if the symptoms worsen or if the condition fails to improve as anticipated.  I provided 5 minutes of non-face-to-face time during this encounter.   Abbie Sons, MD

## 2019-09-13 ENCOUNTER — Other Ambulatory Visit: Payer: Self-pay | Admitting: Internal Medicine

## 2019-09-13 DIAGNOSIS — R918 Other nonspecific abnormal finding of lung field: Secondary | ICD-10-CM

## 2019-09-24 ENCOUNTER — Ambulatory Visit: Payer: BC Managed Care – PPO

## 2019-10-07 ENCOUNTER — Other Ambulatory Visit: Payer: Self-pay

## 2019-10-07 ENCOUNTER — Ambulatory Visit
Admission: RE | Admit: 2019-10-07 | Discharge: 2019-10-07 | Disposition: A | Discharge status: Home / Self Care | Payer: BC Managed Care – PPO | Source: Ambulatory Visit | Attending: Internal Medicine | Admitting: Internal Medicine

## 2019-10-07 DIAGNOSIS — R918 Other nonspecific abnormal finding of lung field: Secondary | ICD-10-CM | POA: Insufficient documentation

## 2019-11-18 DIAGNOSIS — E041 Nontoxic single thyroid nodule: Secondary | ICD-10-CM | POA: Insufficient documentation

## 2019-11-18 DIAGNOSIS — E611 Iron deficiency: Secondary | ICD-10-CM | POA: Insufficient documentation

## 2020-01-06 ENCOUNTER — Other Ambulatory Visit: Payer: Self-pay | Admitting: Internal Medicine

## 2020-01-06 DIAGNOSIS — R918 Other nonspecific abnormal finding of lung field: Secondary | ICD-10-CM

## 2020-03-06 ENCOUNTER — Ambulatory Visit: Payer: BC Managed Care – PPO

## 2020-03-13 ENCOUNTER — Ambulatory Visit: Payer: BC Managed Care – PPO

## 2020-04-10 ENCOUNTER — Other Ambulatory Visit: Payer: Self-pay

## 2020-04-10 ENCOUNTER — Ambulatory Visit
Admission: RE | Admit: 2020-04-10 | Discharge: 2020-04-10 | Disposition: A | Discharge status: Home / Self Care | Payer: BC Managed Care – PPO | Source: Ambulatory Visit | Attending: Internal Medicine | Admitting: Internal Medicine

## 2020-04-10 DIAGNOSIS — R918 Other nonspecific abnormal finding of lung field: Secondary | ICD-10-CM | POA: Insufficient documentation

## 2020-04-28 ENCOUNTER — Ambulatory Visit: Payer: BC Managed Care – PPO | Admitting: Urology

## 2020-04-28 ENCOUNTER — Ambulatory Visit
Admission: RE | Admit: 2020-04-28 | Discharge: 2020-04-28 | Disposition: A | Discharge status: Home / Self Care | Payer: BC Managed Care – PPO | Source: Ambulatory Visit | Attending: Urology | Admitting: Urology

## 2020-04-28 ENCOUNTER — Ambulatory Visit
Admission: RE | Admit: 2020-04-28 | Discharge: 2020-04-28 | Disposition: A | Discharge status: Home / Self Care | Payer: BC Managed Care – PPO | Attending: Urology | Admitting: Urology

## 2020-04-28 ENCOUNTER — Encounter: Payer: Self-pay | Admitting: Urology

## 2020-04-28 ENCOUNTER — Other Ambulatory Visit: Payer: Self-pay

## 2020-04-28 VITALS — BP 128/80 | HR 65 | Ht 67.5 in | Wt 215.0 lb

## 2020-04-28 DIAGNOSIS — N200 Calculus of kidney: Secondary | ICD-10-CM | POA: Insufficient documentation

## 2020-04-28 NOTE — Patient Instructions (Signed)
Dietary Guidelines to Help Prevent Kidney Stones Kidney stones are deposits of minerals and salts that form inside your kidneys. Your risk of developing kidney stones may be greater depending on your diet, your lifestyle, the medicines you take, and whether you have certain medical conditions. Most people can reduce their chances of developing kidney stones by following the instructions below. Depending on your overall health and the type of kidney stones you tend to develop, your dietitian may give you more specific instructions. What are tips for following this plan? Reading food labels  Choose foods with "no salt added" or "low-salt" labels. Limit your sodium intake to less than 1500 mg per day.  Choose foods with calcium for each meal and snack. Try to eat about 300 mg of calcium at each meal. Foods that contain 200-500 mg of calcium per serving include: ? 8 oz (237 ml) of milk, fortified nondairy milk, and fortified fruit juice. ? 8 oz (237 ml) of kefir, yogurt, and soy yogurt. ? 4 oz (118 ml) of tofu. ? 1 oz of cheese. ? 1 cup (300 g) of dried figs. ? 1 cup (91 g) of cooked broccoli. ? 1-3 oz can of sardines or mackerel.  Most people need 1000 to 1500 mg of calcium each day. Talk to your dietitian about how much calcium is recommended for you. Shopping  Buy plenty of fresh fruits and vegetables. Most people do not need to avoid fruits and vegetables, even if they contain nutrients that may contribute to kidney stones.  When shopping for convenience foods, choose: ? Whole pieces of fruit. ? Premade salads with dressing on the side. ? Low-fat fruit and yogurt smoothies.  Avoid buying frozen meals or prepared deli foods.  Look for foods with live cultures, such as yogurt and kefir. Cooking  Do not add salt to food when cooking. Place a salt shaker on the table and allow each person to add his or her own salt to taste.  Use vegetable protein, such as beans, textured vegetable  protein (TVP), or tofu instead of meat in pasta, casseroles, and soups. Meal planning   Eat less salt, if told by your dietitian. To do this: ? Avoid eating processed or premade food. ? Avoid eating fast food.  Eat less animal protein, including cheese, meat, poultry, or fish, if told by your dietitian. To do this: ? Limit the number of times you have meat, poultry, fish, or cheese each week. Eat a diet free of meat at least 2 days a week. ? Eat only one serving each day of meat, poultry, fish, or seafood. ? When you prepare animal protein, cut pieces into small portion sizes. For most meat and fish, one serving is about the size of one deck of cards.  Eat at least 5 servings of fresh fruits and vegetables each day. To do this: ? Keep fruits and vegetables on hand for snacks. ? Eat 1 piece of fruit or a handful of berries with breakfast. ? Have a salad and fruit at lunch. ? Have two kinds of vegetables at dinner.  Limit foods that are high in a substance called oxalate. These include: ? Spinach. ? Rhubarb. ? Beets. ? Potato chips and french fries. ? Nuts.  If you regularly take a diuretic medicine, make sure to eat at least 1-2 fruits or vegetables high in potassium each day. These include: ? Avocado. ? Banana. ? Orange, prune, carrot, or tomato juice. ? Baked potato. ? Cabbage. ? Beans and split   peas. General instructions   Drink enough fluid to keep your urine clear or pale yellow. This is the most important thing you can do.  Talk to your health care provider and dietitian about taking daily supplements. Depending on your health and the cause of your kidney stones, you may be advised: ? Not to take supplements with vitamin C. ? To take a calcium supplement. ? To take a daily probiotic supplement. ? To take other supplements such as magnesium, fish oil, or vitamin B6.  Take all medicines and supplements as told by your health care provider.  Limit alcohol intake to no  more than 1 drink a day for nonpregnant women and 2 drinks a day for men. One drink equals 12 oz of beer, 5 oz of wine, or 1 oz of hard liquor.  Lose weight if told by your health care provider. Work with your dietitian to find strategies and an eating plan that works best for you. What foods are not recommended? Limit your intake of the following foods, or as told by your dietitian. Talk to your dietitian about specific foods you should avoid based on the type of kidney stones and your overall health. Grains Breads. Bagels. Rolls. Baked goods. Salted crackers. Cereal. Pasta. Vegetables Spinach. Rhubarb. Beets. Canned vegetables. Pickles. Olives. Meats and other protein foods Nuts. Nut butters. Large portions of meat, poultry, or fish. Salted or cured meats. Deli meats. Hot dogs. Sausages. Dairy Cheese. Beverages Regular soft drinks. Regular vegetable juice. Seasonings and other foods Seasoning blends with salt. Salad dressings. Canned soups. Soy sauce. Ketchup. Barbecue sauce. Canned pasta sauce. Casseroles. Pizza. Lasagna. Frozen meals. Potato chips. French fries. Summary  You can reduce your risk of kidney stones by making changes to your diet.  The most important thing you can do is drink enough fluid. You should drink enough fluid to keep your urine clear or pale yellow.  Ask your health care provider or dietitian how much protein from animal sources you should eat each day, and also how much salt and calcium you should have each day. This information is not intended to replace advice given to you by your health care provider. Make sure you discuss any questions you have with your health care provider. Document Revised: 03/24/2019 Document Reviewed: 11/12/2016 Elsevier Patient Education  2020 Elsevier Inc.  

## 2020-04-28 NOTE — Progress Notes (Signed)
   04/28/2020 10:30 AM   Ruben Webster [birth year] [id number]  Referring provider: Adin Hector, MD [facility] [place],  [place] [address]  Chief Complaint  Patient presents with  . Nephrolithiasis   Urologic history: 1.  Nephrolithiasis -Nonobstructing 4 mm left lower pole calculus  HPI: 49 y.o. male presents for annual follow-up of nephrolithiasis.  -Denies flank, abdominal or pelvic pain -No bothersome LUTS or hematuria   PMH: Past Medical History:  Diagnosis Date  . Complication of anesthesia   . History of kidney stones    H/O STONES  . Kidney stone   . PONV (postoperative nausea and vomiting)   . Renal cyst     Surgical History: Past Surgical History:  Procedure Laterality Date  . APPENDECTOMY    . CHOLECYSTECTOMY    . INGUINAL HERNIA REPAIR Left 10/01/2016   Procedure: HERNIA REPAIR INGUINAL ADULT with mesh;  Surgeon: Leonie Green, MD;  Location: [facility] ORS;  Service: General;  Laterality: Left;    Home Medications:  Allergies as of 04/28/2020      Reactions   Amoxicillin Rash   Other Rash   GLUTEN ALLERGY   Penicillins Rash      Medication List       Accurate as of Apr 28, 2020 10:30 AM. If you have any questions, ask your nurse or doctor.        STOP taking these medications   HYDROcodone-acetaminophen 5-325 MG tablet Commonly known as: Norco Stopped by: Abbie Sons, MD   ibuprofen 200 MG tablet Commonly known as: ADVIL Stopped by: Abbie Sons, MD   pantoprazole 40 MG tablet Commonly known as: PROTONIX Stopped by: Abbie Sons, MD   sucralfate 1 g tablet Commonly known as: CARAFATE Stopped by: Abbie Sons, MD       Allergies:  Allergies  Allergen Reactions  . Amoxicillin Rash  . Other Rash    GLUTEN ALLERGY  . Penicillins Rash    Family History: Family History  Problem Relation Age of Onset  . Prostate cancer Father   . Bladder Cancer Neg Hx   . Kidney cancer Neg  Hx     Social History:  reports that he has never smoked. He has never used smokeless tobacco. He reports that he does not drink alcohol or use drugs.   Physical Exam: BP 128/80 (BP Location: Left Arm, Patient Position: Sitting, Cuff Size: Normal)   Pulse 65   Ht 5' 7.5" (1.715 m)   Wt 215 lb (97.5 kg)   BMI 33.18 kg/m   Constitutional:  Alert and oriented, No acute distress. HEENT: [place] AT, moist mucus membranes.  Trachea midline, no masses. Cardiovascular: No clubbing, cyanosis, or edema. Respiratory: Normal respiratory effort, no increased work of breathing. GI: Abdomen is soft, nontender, nondistended, no abdominal masses Neurologic: Grossly intact, no focal deficits, moving all 4 extremities. Psychiatric: Normal mood and affect.   Assessment & Plan:    - Nephrolithiasis Asymptomatic lower pole renal calculus.  He has not had his annual KUB and will get done within the next 4 weeks.  Will call with results.  Follow-up 1 year with KUB and if stable will go to every other year.   Abbie Sons, [place] [address], [place], [place] [address] [phone]

## 2020-05-01 ENCOUNTER — Telehealth: Payer: Self-pay | Admitting: *Deleted

## 2020-05-01 NOTE — Telephone Encounter (Signed)
-----   Message from Abbie Sons, MD sent at 04/30/2020 10:46 AM EDT ----- KUB shows a stable left renal calculus

## 2020-05-01 NOTE — Telephone Encounter (Signed)
Notified patient as instructed, patient pleased. Discussed follow-up appointments, patient agrees  

## 2021-05-04 ENCOUNTER — Ambulatory Visit: Payer: BC Managed Care – PPO | Admitting: Urology

## 2021-05-04 ENCOUNTER — Encounter: Payer: Self-pay | Admitting: Urology

## 2021-05-04 ENCOUNTER — Other Ambulatory Visit: Payer: Self-pay

## 2021-05-04 VITALS — BP 111/75 | HR 74 | Ht 67.5 in | Wt 220.3 lb

## 2021-05-04 DIAGNOSIS — N200 Calculus of kidney: Secondary | ICD-10-CM | POA: Diagnosis not present

## 2021-05-04 DIAGNOSIS — M545 Low back pain, unspecified: Secondary | ICD-10-CM | POA: Diagnosis not present

## 2021-05-04 DIAGNOSIS — R351 Nocturia: Secondary | ICD-10-CM | POA: Diagnosis not present

## 2021-05-04 DIAGNOSIS — R3915 Urgency of urination: Secondary | ICD-10-CM | POA: Diagnosis not present

## 2021-05-04 NOTE — Progress Notes (Signed)
   05/04/2021 10:22 AM   Ruben Webster [birth year] [id number]  Referring provider: Adin Hector, MD [facility] [place],  [place] [address]  Chief Complaint  Patient presents with  . Nephrolithiasis     Urologic history: 1.  Nephrolithiasis -Nonobstructing 4 mm left lower pole calculus  HPI: 50 y.o. male presents for annual follow-up.   No significant problems since last years visit  Denies flank, abdominal or pelvic pain  Has noted right low back pain for the past 2 months  Denies dysuria, bothersome LUTS or gross hematuria though does note occasional urgency and last week had isolated nocturia x8 which has not recurred   PMH: Past Medical History:  Diagnosis Date  . Complication of anesthesia   . History of kidney stones    H/O STONES  . Kidney stone   . PONV (postoperative nausea and vomiting)   . Renal cyst     Surgical History: Past Surgical History:  Procedure Laterality Date  . APPENDECTOMY    . CHOLECYSTECTOMY    . INGUINAL HERNIA REPAIR Left 10/01/2016   Procedure: HERNIA REPAIR INGUINAL ADULT with mesh;  Surgeon: Leonie Green, MD;  Location: [facility] ORS;  Service: General;  Laterality: Left;    Home Medications:  Allergies as of 05/04/2021      Reactions   Amoxicillin Rash   Other Rash   GLUTEN ALLERGY   Penicillins Rash      Medication List    as of May 04, 2021 10:22 AM   You have not been prescribed any medications.     Allergies:  Allergies  Allergen Reactions  . Amoxicillin Rash  . Other Rash    GLUTEN ALLERGY  . Penicillins Rash    Family History: Family History  Problem Relation Age of Onset  . Prostate cancer Father   . Bladder Cancer Neg Hx   . Kidney cancer Neg Hx     Social History:  reports that he has never smoked. He has never used smokeless tobacco. He reports that he does not drink alcohol and does not use drugs.   Physical Exam: BP 111/75 (BP Location: Left Arm,  Patient Position: Sitting, Cuff Size: Large)   Pulse 74   Ht 5' 7.5" (1.715 m)   Wt 220 lb 4.8 oz (99.9 kg)   BMI 33.99 kg/m   Constitutional:  Alert and oriented, No acute distress. HEENT: [place] AT, moist mucus membranes.  Trachea midline, no masses. Cardiovascular: No clubbing, cyanosis, or edema. Respiratory: Normal respiratory effort, no increased work of breathing.   Assessment & Plan:    1.  Left nephrolithiasis  KUB ordered which he will get done next week and will call with results  2.  Urinary urgency/nocturia  New problem  Symptoms mild and intermittent  We discussed possible dietary triggers and medical management if symptoms worsen  3.  Right low back pain  New problem  Most likely musculoskeletal  KUB as above    Abbie Sons, MD  [facility] [address], [place], [place] [address] [phone]

## 2021-09-19 ENCOUNTER — Telehealth: Payer: Self-pay

## 2021-09-19 ENCOUNTER — Encounter: Payer: Self-pay | Admitting: Internal Medicine

## 2021-09-19 NOTE — Telephone Encounter (Signed)
PATIENT WANTS A CONSULTATION FIRST

## 2021-10-03 ENCOUNTER — Telehealth: Payer: Self-pay

## 2021-10-03 NOTE — Telephone Encounter (Signed)
Patient is ready to get Colonoscopy scheduled. Clinical staff will follow up with patient.

## 2021-10-05 ENCOUNTER — Other Ambulatory Visit: Payer: Self-pay

## 2021-10-05 DIAGNOSIS — Z1211 Encounter for screening for malignant neoplasm of colon: Secondary | ICD-10-CM

## 2021-10-05 MED ORDER — NA SULFATE-K SULFATE-MG SULF 17.5-3.13-1.6 GM/177ML PO SOLN: 1.0000 | Freq: Once | ORAL | 0 refills | Status: AC

## 2021-10-05 NOTE — Progress Notes (Signed)
Gastroenterology Pre-Procedure Review  Request Date: 10/29/21 Requesting Physician: Dr. Marius Ditch  PATIENT REVIEW QUESTIONS: The patient responded to the following health history questions as indicated:    1. Are you having any GI issues? no 2. Do you have a personal history of Polyps? no 3. Do you have a family history of Colon Cancer or Polyps? no 4. Diabetes Mellitus? no 5. Joint replacements in the past 12 months?no 6. Major health problems in the past 3 months?no 7. Any artificial heart valves, MVP, or defibrillator?no    MEDICATIONS & ALLERGIES:    Patient reports the following regarding taking any anticoagulation/antiplatelet therapy:   Plavix, Coumadin, Eliquis, Xarelto, Lovenox, Pradaxa, Brilinta, or Effient? no Aspirin? no  Patient confirms/reports the following medications:  No current outpatient medications on file.   No current facility-administered medications for this visit.    Patient confirms/reports the following allergies:  Allergies  Allergen Reactions   Amoxicillin Rash   Other Rash    GLUTEN ALLERGY   Penicillins Rash    No orders of the defined types were placed in this encounter.   AUTHORIZATION INFORMATION Primary Insurance: 1D#: Group #:  Secondary Insurance: 1D#: Group #:  SCHEDULE INFORMATION: Date: 10/29/21 Time: Location: [facility]

## 2021-10-05 NOTE — Telephone Encounter (Signed)
Procedure scheduled for 10/29/21.

## 2021-10-29 ENCOUNTER — Emergency Department
Admission: EM | Admit: 2021-10-29 | Discharge: 2021-10-29 | Disposition: A | Discharge status: Home / Self Care | Payer: BC Managed Care – PPO | Source: Home / Self Care

## 2021-10-29 ENCOUNTER — Ambulatory Visit
Admission: RE | Admit: 2021-10-29 | Discharge: 2021-10-29 | Disposition: A | Discharge status: Home / Self Care | Payer: BC Managed Care – PPO | Attending: Gastroenterology | Admitting: Gastroenterology

## 2021-10-29 ENCOUNTER — Encounter
Admission: RE | Disposition: A | Discharge status: Home / Self Care | Payer: Self-pay | Source: Home / Self Care | Attending: Gastroenterology

## 2021-10-29 ENCOUNTER — Ambulatory Visit: Payer: BC Managed Care – PPO | Admitting: Anesthesiology

## 2021-10-29 ENCOUNTER — Encounter: Payer: Self-pay | Admitting: Gastroenterology

## 2021-10-29 ENCOUNTER — Other Ambulatory Visit: Payer: Self-pay

## 2021-10-29 DIAGNOSIS — Z1211 Encounter for screening for malignant neoplasm of colon: Secondary | ICD-10-CM

## 2021-10-29 DIAGNOSIS — K633 Ulcer of intestine: Secondary | ICD-10-CM

## 2021-10-29 DIAGNOSIS — K635 Polyp of colon: Secondary | ICD-10-CM | POA: Diagnosis not present

## 2021-10-29 DIAGNOSIS — Z9049 Acquired absence of other specified parts of digestive tract: Secondary | ICD-10-CM | POA: Insufficient documentation

## 2021-10-29 DIAGNOSIS — K5730 Diverticulosis of large intestine without perforation or abscess without bleeding: Secondary | ICD-10-CM | POA: Insufficient documentation

## 2021-10-29 HISTORY — PX: COLONOSCOPY WITH PROPOFOL: SHX5780

## 2021-10-29 HISTORY — DX: Celiac disease: K90.0

## 2021-10-29 SURGERY — COLONOSCOPY WITH PROPOFOL
Anesthesia: General

## 2021-10-29 MED ORDER — PROPOFOL 500 MG/50ML IV EMUL
INTRAVENOUS | Status: DC | PRN
  Administered 2021-10-29: 150 ug/kg/min via INTRAVENOUS

## 2021-10-29 MED ORDER — SODIUM CHLORIDE 0.9 % IV SOLN: INTRAVENOUS | Status: DC

## 2021-10-29 NOTE — Op Note (Signed)
[facility] Gastroenterology Patient Name: Ruben Webster Procedure Date: 10/29/2021 10:28 AM MRN: [id number] Account #: [id number] Date of Birth: 11/18/1971 Admit Type: Outpatient Age: 50 Room: [facility] ENDO ROOM 1 Gender: Male Note Status: Finalized Instrument Name: Colonscope [id number] Procedure:             Colonoscopy Indications:           Screening for colorectal malignant neoplasm Providers:             Lin Landsman MD, MD Medicines:             General Anesthesia Complications:         No immediate complications. Estimated blood loss: None. Procedure:             Pre-Anesthesia Assessment:                        - Prior to the procedure, a History and Physical was                         performed, and patient medications and allergies were                         reviewed. The patient is competent. The risks and                         benefits of the procedure and the sedation options and                         risks were discussed with the patient. All questions                         were answered and informed consent was obtained.                         Patient identification and proposed procedure were                         verified by the physician, the nurse, the                         anesthesiologist, the anesthetist and the technician                         in the pre-procedure area in the procedure room in the                         endoscopy suite. Mental Status Examination: alert and                         oriented. Airway Examination: normal oropharyngeal                         airway and neck mobility. Respiratory Examination:                         clear to auscultation. CV Examination: normal.                         Prophylactic Antibiotics: The  patient does not require                         prophylactic antibiotics. Prior Anticoagulants: The                         patient has taken no previous anticoagulant or                          antiplatelet agents. ASA Grade Assessment: II - A                         patient with mild systemic disease. After reviewing                         the risks and benefits, the patient was deemed in                         satisfactory condition to undergo the procedure. The                         anesthesia plan was to use general anesthesia.                         Immediately prior to administration of medications,                         the patient was re-assessed for adequacy to receive                         sedatives. The heart rate, respiratory rate, oxygen                         saturations, blood pressure, adequacy of pulmonary                         ventilation, and response to care were monitored                         throughout the procedure. The physical status of the                         patient was re-assessed after the procedure.                        After obtaining informed consent, the colonoscope was                         passed under direct vision. Throughout the procedure,                         the patient's blood pressure, pulse, and oxygen                         saturations were monitored continuously. The                         Colonoscope was introduced through the anus and  advanced to the the terminal ileum, with                         identification of the appendiceal orifice and IC                         valve. The colonoscopy was performed without                         difficulty. The patient tolerated the procedure well.                         The quality of the bowel preparation was evaluated                         using the BBPS [facility] Bowel Preparation Scale) with                         scores of: Right Colon = 3, Transverse Colon = 3 and                         Left Colon = 3 (entire mucosa seen well with no                         residual staining, small fragments of stool or opaque                          liquid). The total BBPS score equals 9. Findings:      The perianal and digital rectal examinations were normal. Pertinent       negatives include normal sphincter tone and no palpable rectal lesions.      The terminal ileum contained one sessile, non-bleeding polyp. The polyp       was 2 mm in diameter. The polyp was removed with a cold biopsy forceps.       Resection and retrieval were complete.      The entire examined colon appeared normal.      The retroflexed view of the distal rectum and anal verge was normal and       showed no anal or rectal abnormalities.      A few diverticula were found in the sigmoid colon. Impression:            - One polyp in the terminal ileum, removed with a cold                         biopsy forceps. Resected and retrieved.                        - The entire examined colon is normal.                        - The distal rectum and anal verge are normal on                         retroflexion view.                        - Diverticulosis in the sigmoid colon. Recommendation:        -  Discharge patient to home (with escort).                        - Resume previous diet today.                        - Continue present medications.                        - Await pathology results.                        - Repeat colonoscopy in 10 years for screening                         purposes. Procedure Code(s):     --- Professional ---                        [phone], Colonoscopy, flexible; with biopsy, single or                         multiple Diagnosis Code(s):     --- Professional ---                        Z12.11, Encounter for screening for malignant neoplasm                         of colon                        D13.39, Benign neoplasm of other parts of small                         intestine                        K57.30, Diverticulosis of large intestine without                         perforation or abscess without bleeding CPT copyright  2019 American Medical Association. All rights reserved. The codes documented in this report are preliminary and upon coder review may  be revised to meet current compliance requirements. Dr. Ulyess Mort Lin Landsman MD, MD 10/29/2021 11:04:02 AM This report has been signed electronically. Number of Addenda: 0 Note Initiated On: 10/29/2021 10:28 AM Scope Withdrawal Time: 0 hours 12 minutes 16 seconds  Total Procedure Duration: 0 hours 14 minutes 47 seconds  Estimated Blood Loss:  Estimated blood loss: none.      [facility]

## 2021-10-29 NOTE — Transfer of Care (Signed)
Immediate Anesthesia Transfer of Care Note  Patient: Ruben Webster  Procedure(s) Performed: COLONOSCOPY WITH PROPOFOL  Patient Location: PACU  Anesthesia Type:MAC  Level of Consciousness: awake and sedated  Airway & Oxygen Therapy: Patient Spontanous Breathing and Patient connected to nasal cannula oxygen  Post-op Assessment: Report given to RN and Post -op Vital signs reviewed and stable  Post vital signs: Reviewed and stable  Last Vitals:  Vitals Value Taken Time  BP    Temp    Pulse    Resp    SpO2      Last Pain:  Vitals:   10/29/21 0950  TempSrc: Temporal  PainSc: 0-No pain         Complications: No notable events documented.

## 2021-10-29 NOTE — Anesthesia Postprocedure Evaluation (Signed)
Anesthesia Post Note  Patient: Ruben Ruben  Procedure(s) Performed: COLONOSCOPY WITH PROPOFOL  Patient location during evaluation: Endoscopy Anesthesia Type: General Level of consciousness: awake and alert Pain management: pain level controlled Vital Signs Assessment: post-procedure vital signs reviewed and stable Respiratory status: spontaneous breathing, nonlabored ventilation, respiratory function stable and patient connected to nasal cannula oxygen Cardiovascular status: blood pressure returned to baseline and stable Postop Assessment: no apparent nausea or vomiting Anesthetic complications: no   No notable events documented.   Last Vitals:  Vitals:   10/29/21 1126 10/29/21 1131  BP:  (!) 141/98  Pulse: 82 75  Resp: 17 17  Temp:    SpO2: 98% 98%    Last Pain:  Vitals:   10/29/21 1131  TempSrc:   PainSc: 0-No pain                 Precious Haws Debbrah Sampedro

## 2021-10-29 NOTE — H&P (Signed)
  Cephas Darby, MD [address]  [place]  [place], [place] [address]  Main: [phone]  Fax: [phone] Pager: [phone]  Primary Care Physician:  Adin Hector, MD Primary Gastroenterologist:  Dr. Cephas Darby  Pre-Procedure History & Physical: HPI:  Ruben Webster is a 50 y.o. male is here for an colonoscopy.   Past Medical History:  Diagnosis Date   Celiac disease    Complication of anesthesia    History of kidney stones    H/O STONES   Kidney stone    PONV (postoperative nausea and vomiting)    Renal cyst     Past Surgical History:  Procedure Laterality Date   APPENDECTOMY     CHOLECYSTECTOMY     INGUINAL HERNIA REPAIR Left 10/01/2016   Procedure: HERNIA REPAIR INGUINAL ADULT with mesh;  Surgeon: Leonie Green, MD;  Location: [facility] ORS;  Service: General;  Laterality: Left;    Prior to Admission medications   Not on File    Allergies as of 10/05/2021 - Review Complete 05/04/2021  Allergen Reaction Noted   Amoxicillin Rash 09/27/2016   Other Rash 02/11/2016   Penicillins Rash 02/11/2016    Family History  Problem Relation Age of Onset   Prostate cancer Father    Bladder Cancer Neg Hx    Kidney cancer Neg Hx     Social History   Socioeconomic History   Marital status: Married    Spouse name: Not on file   Number of children: Not on file   Years of education: Not on file   Highest education level: Not on file  Occupational History   Not on file  Tobacco Use   Smoking status: Never   Smokeless tobacco: Never  Vaping Use   Vaping Use: Never used  Substance and Sexual Activity   Alcohol use: No   Drug use: No   Sexual activity: Yes  Other Topics Concern   Not on file  Social History Narrative   Not on file   Social Determinants of Health   Financial Resource Strain: Not on file  Food Insecurity: Not on file  Transportation Needs: Not on file  Physical Activity: Not on file  Stress: Not on file  Social  Connections: Not on file  Intimate Partner Violence: Not on file    Review of Systems: See HPI, otherwise negative ROS  Physical Exam: BP (!) 159/106   Pulse 92   Temp (!) 96 F (35.6 C) (Temporal)   Resp 16   Ht 5\' 7"  (1.702 m)   Wt 97.5 kg   SpO2 97%   BMI 33.67 kg/m  General:   Alert,  pleasant and cooperative in NAD Head:  Normocephalic and atraumatic. Neck:  Supple; no masses or thyromegaly. Lungs:  Clear throughout to auscultation.    Heart:  Regular rate and rhythm. Abdomen:  Soft, nontender and nondistended. Normal bowel sounds, without guarding, and without rebound.   Neurologic:  Alert and  oriented x4;  grossly normal neurologically.  Impression/Plan: Korey Lebeda is here for an colonoscopy to be performed for colon cancer screening  Risks, benefits, limitations, and alternatives regarding  colonoscopy have been reviewed with the patient.  Questions have been answered.  All parties agreeable.   Sherri Sear, MD  10/29/2021, 10:00 AM

## 2021-10-29 NOTE — Anesthesia Procedure Notes (Signed)
Date/Time: 10/29/2021 10:49 AM Performed by: Vaughan Sine Pre-anesthesia Checklist: Patient identified, Emergency Drugs available, Suction available, Patient being monitored and Timeout performed Patient Re-evaluated:Patient Re-evaluated prior to induction Oxygen Delivery Method: Simple face mask Preoxygenation: Pre-oxygenation with 100% oxygen Induction Type: IV induction Placement Confirmation: CO2 detector and positive ETCO2

## 2021-10-29 NOTE — Anesthesia Preprocedure Evaluation (Signed)
Anesthesia Evaluation  Patient identified by MRN, date of birth, ID band Patient awake    Reviewed: Allergy & Precautions, NPO status , Patient's Chart, lab work & pertinent test results  History of Anesthesia Complications (+) PONV, PROLONGED EMERGENCE and history of anesthetic complications  Airway Mallampati: III  TM Distance: <3 FB Neck ROM: full    Dental  (+) Chipped   Pulmonary neg pulmonary ROS, neg shortness of breath,    Pulmonary exam normal        Cardiovascular Exercise Tolerance: Good (-) angina(-) DOE negative cardio ROS Normal cardiovascular exam     Neuro/Psych negative neurological ROS  negative psych ROS   GI/Hepatic negative GI ROS, Neg liver ROS,   Endo/Other  negative endocrine ROS  Renal/GU Renal disease  negative genitourinary   Musculoskeletal   Abdominal   Peds  Hematology negative hematology ROS (+)   Anesthesia Other Findings Past Medical History: No date: Celiac disease No date: Complication of anesthesia No date: History of kidney stones     Comment:  H/O STONES No date: Kidney stone No date: PONV (postoperative nausea and vomiting) No date: Renal cyst  Past Surgical History: No date: APPENDECTOMY No date: CHOLECYSTECTOMY 10/01/2016: INGUINAL HERNIA REPAIR; Left     Comment:  Procedure: HERNIA REPAIR INGUINAL ADULT with mesh;                Surgeon: Leonie Green, MD;  Location: [facility] OR[facility];                Service: General;  Laterality: Left;  BMI    Body Mass Index: 33.67 kg/m      Reproductive/Obstetrics negative OB ROS                             Anesthesia Physical Anesthesia Plan  ASA: 2  Anesthesia Plan: General   Post-op Pain Management:    Induction: Intravenous  PONV Risk Score and Plan: Propofol infusion and TIVA  Airway Management Planned: Natural Airway and Nasal Cannula  Additional Equipment:   Intra-op Plan:    Post-operative Plan:   Informed Consent: I have reviewed the patients History and Physical, chart, labs and discussed the procedure including the risks, benefits and alternatives for the proposed anesthesia with the patient or authorized representative who has indicated his/her understanding and acceptance.     Dental Advisory Given  Plan Discussed with: Anesthesiologist, CRNA and Surgeon  Anesthesia Plan Comments: (Patient consented for risks of anesthesia including but not limited to:  - adverse reactions to medications - risk of airway placement if required - damage to eyes, teeth, lips or other oral mucosa - nerve damage due to positioning  - sore throat or hoarseness - Damage to heart, brain, nerves, lungs, other parts of body or loss of life  Patient voiced understanding.)        Anesthesia Quick Evaluation

## 2021-10-30 ENCOUNTER — Encounter: Payer: Self-pay | Admitting: Gastroenterology

## 2021-10-30 ENCOUNTER — Telehealth: Payer: Self-pay

## 2021-10-30 LAB — SURGICAL PATHOLOGY

## 2021-10-30 NOTE — Telephone Encounter (Signed)
Called patient and he understands  results and his abdominal pain is much better now

## 2021-10-30 NOTE — Telephone Encounter (Signed)
-----   Message from Lin Landsman, MD sent at 10/30/2021 11:41 AM EST ----- Please check with the patient if his abdominal pain has improved.  Also, the pathology results from colonoscopy came back normal.  He needs next screening colonoscopy in 10 years  Rohini Vanga

## 2021-10-30 NOTE — Progress Notes (Signed)
Pt stated that he had a lot of pain and went to the er last night. He thinks he has a kidney stone.  He talked to Network engineer . He says he is doing fine this am.

## 2021-11-13 ENCOUNTER — Other Ambulatory Visit: Payer: Self-pay | Admitting: Internal Medicine

## 2021-11-13 DIAGNOSIS — R1084 Generalized abdominal pain: Secondary | ICD-10-CM

## 2021-11-13 DIAGNOSIS — R109 Unspecified abdominal pain: Secondary | ICD-10-CM

## 2021-11-13 DIAGNOSIS — R300 Dysuria: Secondary | ICD-10-CM

## 2021-11-27 ENCOUNTER — Encounter: Payer: Self-pay | Admitting: Urology

## 2021-12-04 ENCOUNTER — Ambulatory Visit
Admission: RE | Admit: 2021-12-04 | Discharge: 2021-12-04 | Disposition: A | Discharge status: Home / Self Care | Payer: BC Managed Care – PPO | Source: Ambulatory Visit | Attending: Internal Medicine | Admitting: Internal Medicine

## 2021-12-04 ENCOUNTER — Other Ambulatory Visit: Payer: Self-pay

## 2021-12-04 DIAGNOSIS — R300 Dysuria: Secondary | ICD-10-CM

## 2021-12-04 DIAGNOSIS — R1084 Generalized abdominal pain: Secondary | ICD-10-CM

## 2021-12-04 DIAGNOSIS — R109 Unspecified abdominal pain: Secondary | ICD-10-CM | POA: Diagnosis present

## 2021-12-04 DIAGNOSIS — R10A0 Flank pain, unspecified side: Secondary | ICD-10-CM

## 2022-05-06 ENCOUNTER — Ambulatory Visit: Payer: Self-pay | Admitting: Urology

## 2022-05-06 ENCOUNTER — Other Ambulatory Visit: Payer: Self-pay | Admitting: Family Medicine

## 2022-05-06 DIAGNOSIS — N200 Calculus of kidney: Secondary | ICD-10-CM

## 2022-05-08 ENCOUNTER — Ambulatory Visit: Payer: BC Managed Care – PPO | Admitting: Urology

## 2022-05-08 ENCOUNTER — Encounter: Payer: Self-pay | Admitting: Urology

## 2022-05-08 VITALS — BP 160/100 | HR 76 | Ht 67.0 in | Wt 215.0 lb

## 2022-05-08 DIAGNOSIS — N200 Calculus of kidney: Secondary | ICD-10-CM

## 2022-05-08 NOTE — Progress Notes (Signed)
   05/08/2022 11:13 AM   Ruben Webster 11/24/1971 [id number]  Referring provider: Adin Hector, MD [facility] [place],  [place] [address]  Chief Complaint  Patient presents with   Nephrolithiasis     Urologic history: 1.  Nephrolithiasis -Nonobstructing 4 mm left lower pole calculus  HPI: 51 y.o. male presentsents for annual follow-up.  Episode right flank pain December 2022.  CT showed stable left lower pole renal calculus and a 2 mm right distal ureteral calculus which he subsequently passed.  Asymptomatic since that time Has increased his water intake and limiting dietary oxalate No bothersome LUTS   PMH: Past Medical History:  Diagnosis Date   Celiac disease    Complication of anesthesia    History of kidney stones    H/O STONES   Kidney stone    PONV (postoperative nausea and vomiting)    Renal cyst     Surgical History: Past Surgical History:  Procedure Laterality Date   APPENDECTOMY     CHOLECYSTECTOMY     COLONOSCOPY WITH PROPOFOL N/A 10/29/2021   Procedure: COLONOSCOPY WITH PROPOFOL;  Surgeon: Lin Landsman, MD;  Location: [facility];  Service: Gastroenterology;  Laterality: N/A;   INGUINAL HERNIA REPAIR Left 10/01/2016   Procedure: HERNIA REPAIR INGUINAL ADULT with mesh;  Surgeon: Leonie Green, MD;  Location: [facility];  Service: General;  Laterality: Left;    Home Medications:  Allergies as of 05/08/2022       Reactions   Amoxicillin Rash   Other Rash   GLUTEN ALLERGY   Penicillins Rash        Medication List    as of May 08, 2022 11:13 AM   You have not been prescribed any medications.     Allergies:  Allergies  Allergen Reactions   Amoxicillin Rash   Other Rash    GLUTEN ALLERGY   Penicillins Rash    Family History: Family History  Problem Relation Age of Onset   Prostate cancer Father    Bladder Cancer Neg Hx    Kidney cancer Neg Hx     Social History:  reports that he  has never smoked. He has never used smokeless tobacco. He reports that he does not drink alcohol and does not use drugs.   Physical Exam: BP (!) 160/100   Pulse 76   Ht '5\' 7"'$  (1.702 m)   Wt 215 lb (97.5 kg)   BMI 33.67 kg/m   Constitutional:  Alert and oriented, No acute distress. HEENT: [place] AT, moist mucus membranes.  Trachea midline, no masses. Cardiovascular: No clubbing, cyanosis, or edema. Respiratory: Normal respiratory effort, no increased work of breathing.   Assessment & Plan:    1.  Left nephrolithiasis Will defer KUB today since CT performed December 2022 Follow-up 1 year with KUB; earlier for recurrent stone symptoms    Abbie Sons, MD  [place] [address] [place],  [address] [phone]

## 2023-05-06 ENCOUNTER — Ambulatory Visit
Admission: RE | Admit: 2023-05-06 | Discharge: 2023-05-06 | Disposition: A | Discharge status: Home / Self Care | Payer: BC Managed Care – PPO | Attending: Urology | Admitting: Urology

## 2023-05-06 ENCOUNTER — Ambulatory Visit
Admission: RE | Admit: 2023-05-06 | Discharge: 2023-05-06 | Disposition: A | Discharge status: Home / Self Care | Payer: BC Managed Care – PPO | Source: Ambulatory Visit | Attending: Urology | Admitting: Urology

## 2023-05-06 DIAGNOSIS — N200 Calculus of kidney: Secondary | ICD-10-CM | POA: Diagnosis present

## 2023-05-07 ENCOUNTER — Encounter: Payer: Self-pay | Admitting: Urology

## 2023-05-07 ENCOUNTER — Ambulatory Visit: Payer: BC Managed Care – PPO | Admitting: Urology

## 2023-05-07 VITALS — BP 114/76 | HR 101 | Ht 67.0 in | Wt 225.0 lb

## 2023-05-07 DIAGNOSIS — N200 Calculus of kidney: Secondary | ICD-10-CM | POA: Diagnosis not present

## 2023-05-07 DIAGNOSIS — R399 Unspecified symptoms and signs involving the genitourinary system: Secondary | ICD-10-CM

## 2023-05-07 DIAGNOSIS — N201 Calculus of ureter: Secondary | ICD-10-CM

## 2023-05-07 MED ORDER — TAMSULOSIN HCL 0.4 MG PO CAPS
0.4000 mg | ORAL_CAPSULE | Freq: Every day | ORAL | 0 refills | Status: AC
Start: 2023-05-07 — End: 2024-05-06

## 2023-05-07 NOTE — Progress Notes (Signed)
I, Duke Salvia, acting as a Neurosurgeon for Riki Altes, MD., have documented all relevant documentation on the behalf of Riki Altes, MD, as directed by  Riki Altes, MD while in the presence of Riki Altes, MD.   05/07/2023 3:17 PM   Ruben Webster Nov 14, 1971 [id number]  Referring provider: Lynnea Ferrier, MD [address] [facility] [place],  [place] [address]  Chief Complaint  Patient presents with   Nephrolithiasis     Urologic history: 1.  Nephrolithiasis -Nonobstructing 4 mm left lower pole calculus  HPI: 52 y.o. male presents for annual follow-up.  Doing well since last visit No bothersome LUTS Denies dysuria, gross hematuria Denies flank, abdominal or pelvic pain  Recently noted some increased frequency/urgency.   PMH: Past Medical History:  Diagnosis Date   Celiac disease    Complication of anesthesia    History of kidney stones    H/O STONES   Kidney stone    PONV (postoperative nausea and vomiting)    Renal cyst     Surgical History: Past Surgical History:  Procedure Laterality Date   APPENDECTOMY     CHOLECYSTECTOMY     COLONOSCOPY WITH PROPOFOL N/A 10/29/2021   Procedure: COLONOSCOPY WITH PROPOFOL;  Surgeon: Toney Reil, MD;  Location: [facility];  Service: Gastroenterology;  Laterality: N/A;   INGUINAL HERNIA REPAIR Left 10/01/2016   Procedure: HERNIA REPAIR INGUINAL ADULT with mesh;  Surgeon: Nadeen Landau, MD;  Location: [facility];  Service: General;  Laterality: Left;    Home Medications:  Allergies as of 05/07/2023       Reactions   Amoxicillin Rash   Other Rash   GLUTEN ALLERGY   Penicillins Rash        Medication List        Accurate as of May 07, 2023  3:17 PM. If you have any questions, ask your nurse or doctor.          tamsulosin 0.4 MG Caps capsule Commonly known as: FLOMAX Take 1 capsule (0.4 mg total) by mouth daily. What changed:  how much to take when to  take this Changed by: Riki Altes, MD        Allergies:  Allergies  Allergen Reactions   Amoxicillin Rash   Other Rash    GLUTEN ALLERGY   Penicillins Rash    Family History: Family History  Problem Relation Age of Onset   Prostate cancer Father    Bladder Cancer Neg Hx    Kidney cancer Neg Hx     Social History:  reports that he has never smoked. He has never used smokeless tobacco. He reports that he does not drink alcohol and does not use drugs.   Physical Exam: BP 114/76   Pulse (!) 101   Ht 5\' 7"  (1.702 m)   Wt 225 lb (102.1 kg)   BMI 35.24 kg/m   Constitutional:  Alert and oriented, No acute distress. HEENT: [place] AT, moist mucus membranes.  Trachea midline, no masses. Cardiovascular: No clubbing, cyanosis, or edema. Respiratory: Normal respiratory effort, no increased work of breathing.   Pertinent Imaging: KUB performed 05/06/2023 personally reviewed and interpreted. Small left lower pole renal calculus. There is also a linear 2x4 mm calcification in the right true bony pelvis, which could represent a distal ureteral calculus.    Assessment & Plan:    1.  Left nephrolithiasis Stable.  2. Possible right ureteral calculus Recommended an alpha  blocker trial and he initially did not want to take Flomax because his father in law told him it lowered BP. We discussed the option of Silidosin and the most common side effect is loss of seminal emission. He elected to start Tamsulosin 0.4 mg daily. Repeat KUB 2-3 weeks.  3. Lower urinary tract symptoms Flomax trial as above.   I have reviewed the above documentation for accuracy and completeness, and I agree with the above.   Riki Altes, MD  [facility] [address] [place], [place] [address] [phone]

## 2023-05-09 ENCOUNTER — Ambulatory Visit: Payer: BC Managed Care – PPO | Admitting: Urology

## 2023-05-26 ENCOUNTER — Ambulatory Visit
Admission: RE | Admit: 2023-05-26 | Discharge: 2023-05-26 | Disposition: A | Discharge status: Home / Self Care | Payer: BC Managed Care – PPO | Source: Ambulatory Visit | Attending: Urology | Admitting: Urology

## 2023-05-26 ENCOUNTER — Ambulatory Visit
Admission: RE | Admit: 2023-05-26 | Discharge: 2023-05-26 | Disposition: A | Discharge status: Home / Self Care | Payer: BC Managed Care – PPO | Attending: Urology | Admitting: Urology

## 2023-05-26 DIAGNOSIS — N200 Calculus of kidney: Secondary | ICD-10-CM | POA: Insufficient documentation

## 2023-06-02 ENCOUNTER — Telehealth: Payer: Self-pay | Admitting: *Deleted

## 2023-06-02 ENCOUNTER — Other Ambulatory Visit: Payer: Self-pay | Admitting: *Deleted

## 2023-06-02 ENCOUNTER — Encounter: Payer: Self-pay | Admitting: Urology

## 2023-06-02 DIAGNOSIS — N200 Calculus of kidney: Secondary | ICD-10-CM

## 2023-06-02 DIAGNOSIS — R1084 Generalized abdominal pain: Secondary | ICD-10-CM

## 2023-06-02 NOTE — Telephone Encounter (Signed)
Notified patient as instructed, order placed .

## 2023-06-02 NOTE — Telephone Encounter (Signed)
-----   Message from Riki Altes, MD sent at 06/01/2023 10:34 AM EDT ----- The right-sided calcification is still present and has not changed.  It was not present on prior x-rays and is suspicious for a right ureteral calculus.  Recommend scheduling renal stone CT and will call with results

## 2023-08-31 ENCOUNTER — Encounter: Payer: Self-pay | Admitting: Urology

## 2024-10-21 ENCOUNTER — Ambulatory Visit

## 2024-10-21 DIAGNOSIS — Z1283 Encounter for screening for malignant neoplasm of skin: Secondary | ICD-10-CM

## 2024-10-21 DIAGNOSIS — L918 Other hypertrophic disorders of the skin: Secondary | ICD-10-CM

## 2024-10-21 DIAGNOSIS — D229 Melanocytic nevi, unspecified: Secondary | ICD-10-CM

## 2024-10-21 DIAGNOSIS — L570 Actinic keratosis: Secondary | ICD-10-CM

## 2024-10-21 DIAGNOSIS — D1801 Hemangioma of skin and subcutaneous tissue: Secondary | ICD-10-CM

## 2024-10-21 DIAGNOSIS — W908XXA Exposure to other nonionizing radiation, initial encounter: Secondary | ICD-10-CM

## 2024-10-21 DIAGNOSIS — L578 Other skin changes due to chronic exposure to nonionizing radiation: Secondary | ICD-10-CM | POA: Diagnosis not present

## 2024-10-21 DIAGNOSIS — L821 Other seborrheic keratosis: Secondary | ICD-10-CM | POA: Diagnosis not present

## 2024-10-21 DIAGNOSIS — L814 Other melanin hyperpigmentation: Secondary | ICD-10-CM

## 2024-10-21 DIAGNOSIS — L720 Epidermal cyst: Secondary | ICD-10-CM

## 2024-10-21 NOTE — Progress Notes (Signed)
 Subjective   Ruben Webster is a 53 y.o. male who presents for the following: Total body skin exam for skin cancer screening and mole check. The patient has spots, moles and lesions to be evaluated, some may be new or changing and the patient may have concern these could be cancer.. Patient is new patient  Today patient reports: Pt here today for skin cancer screening. Pt's wife noticed lesions on the back and chest.  Review of Systems:    No other skin or systemic complaints except as noted in HPI or Assessment and Plan.  The following portions of the chart were reviewed this encounter and updated as appropriate: medications, allergies, medical history  Relevant Medical History:  Family history of skin cancer - father, unknown which type   Objective  Well appearing patient in no apparent distress; mood and affect are within normal limits. Examination was performed of the: Full Skin Examination: scalp, head, eyes, ears, nose, lips, neck, chest, axillae, abdomen, back, buttocks, bilateral upper extremities, bilateral lower extremities, hands, feet, fingers, toes, fingernails, and toenails.   Examination notable for: SKIN EXAM, Angioma(s): Scattered red vascular papule(s)  , Lentigo/lentigines: Scattered pigmented macules that are tan to brown in color and are somewhat non-uniform in shape and concentrated in the sun-exposed areas, Nevus/nevi: Scattered well-demarcated, regular, pigmented macule(s) and/or papule(s)  , Seborrheic Keratosis(es): Stuck-on appearing keratotic papule(s) on the trunk, none  irritated with redness, crusting, edema, and/or partial avulsion, Actinic Damage/Elastosis: chronic sun damage: dyspigmentation, telangiectasia, and wrinkling, Actinic keratosis: Scaly erythematous macule(s) concentrated on sun exposed areas , Milia: Multiple 2-3 mm white papules   Examination limited by: Undergarments   Scalp x 6 (6) Erythematous thin papules/macules with gritty scale.    Assessment & Plan   SKIN CANCER SCREENING PERFORMED TODAY.  BENIGN SKIN FINDINGS  - Lentigines  - Seborrheic keratoses  - Hemangiomas   - Nevus/Multiple Benign Nevi  - Skin tags  - Reassurance provided regarding the benign appearance of lesions noted on exam today; no treatment is indicated in the absence of symptoms/changes. - Reinforced importance of photoprotective strategies including liberal and frequent sunscreen use of a broad-spectrum SPF 30 or greater, use of protective clothing, and sun avoidance for prevention of cutaneous malignancy and photoaging.  Counseled patient on the importance of regular self-skin monitoring as well as routine clinical skin examinations as scheduled.   ACTINIC DAMAGE - Chronic condition, secondary to cumulative UV/sun exposure - Recommend daily broad spectrum sunscreen SPF 30+ to sun-exposed areas, reapply every 2 hours as needed.  - Staying in the shade or wearing long sleeves, sun glasses (UVA+UVB protection) and wide brim hats (4-inch brim around the entire circumference of the hat) are also recommended for sun protection.  - Call for new or changing lesions.  Milia R infraorbital  - benign - may be extracted if symptomatic - observe     Level of service outlined above   Procedures, orders, diagnosis for this visit:  AK (ACTINIC KERATOSIS) (6) Scalp x 6 (6) Actinic keratoses are precancerous spots that appear secondary to cumulative UV radiation exposure/sun exposure over time. They are chronic with expected duration over 1 year. A portion of actinic keratoses will progress to squamous cell carcinoma of the skin. It is not possible to reliably predict which spots will progress to skin cancer and so treatment is recommended to prevent development of skin cancer.  Recommend daily broad spectrum sunscreen SPF 30+ to sun-exposed areas, reapply every 2 hours as needed.  Recommend staying in the shade or wearing long sleeves, sun glasses (UVA+UVB  protection) and wide brim hats (4-inch brim around the entire circumference of the hat). Call for new or changing lesions.  Destruction of lesion - Scalp x 6 (6) Complexity: simple   Destruction method: cryotherapy   Informed consent: discussed and consent obtained   Timeout:  patient name, date of birth, surgical site, and procedure verified Lesion destroyed using liquid nitrogen: Yes   Region frozen until ice ball extended beyond lesion: Yes   Cryo cycles: 1 or 2. Outcome: patient tolerated procedure well with no complications   Post-procedure details: wound care instructions given     AK (actinic keratosis) -     Destruction of lesion    Return to clinic: Return in about 1 year (around 10/21/2025) for TBSE - hx AK.  LILLETTE Rosina Mayans, CMA, am acting as scribe for Lauraine JAYSON Kanaris, MD .  Documentation: I have reviewed the above documentation for accuracy and completeness, and I agree with the above.  Lauraine JAYSON Kanaris, MD

## 2024-10-21 NOTE — Patient Instructions (Signed)

## 2025-10-24 ENCOUNTER — Ambulatory Visit
